# Patient Record
Sex: Male | Born: 1970 | Race: White | Hispanic: No | Marital: Single | Smoking: Never smoker
Health system: Southern US, Community
[De-identification: ages and names within clinical notes are randomized; demographics above are authoritative.]

## PROBLEM LIST (undated history)

## (undated) DIAGNOSIS — J40 Bronchitis, not specified as acute or chronic: Secondary | ICD-10-CM

## (undated) DIAGNOSIS — H65192 Other acute nonsuppurative otitis media, left ear: Principal | ICD-10-CM

## (undated) DIAGNOSIS — E669 Obesity, unspecified: Secondary | ICD-10-CM

## (undated) DIAGNOSIS — R030 Elevated blood-pressure reading, without diagnosis of hypertension: Secondary | ICD-10-CM

## (undated) HISTORY — DX: Elevated blood-pressure reading, without diagnosis of hypertension: R03.0

## (undated) HISTORY — DX: Other acute nonsuppurative otitis media, left ear: H65.192

## (undated) HISTORY — DX: Obesity, unspecified: E66.9

---

## 1998-10-30 ENCOUNTER — Emergency Department (HOSPITAL_COMMUNITY): Admission: EM | Admit: 1998-10-30 | Discharge: 1998-10-30 | Payer: Self-pay | Admitting: Emergency Medicine

## 2016-02-29 ENCOUNTER — Encounter: Payer: Self-pay | Admitting: Osteopathic Medicine

## 2016-02-29 ENCOUNTER — Ambulatory Visit (INDEPENDENT_AMBULATORY_CARE_PROVIDER_SITE_OTHER): Payer: BLUE CROSS/BLUE SHIELD | Admitting: Osteopathic Medicine

## 2016-02-29 VITALS — BP 158/95 | HR 75 | Ht 67.0 in | Wt 252.0 lb

## 2016-02-29 DIAGNOSIS — H65192 Other acute nonsuppurative otitis media, left ear: Secondary | ICD-10-CM | POA: Insufficient documentation

## 2016-02-29 DIAGNOSIS — IMO0001 Reserved for inherently not codable concepts without codable children: Secondary | ICD-10-CM

## 2016-02-29 DIAGNOSIS — R030 Elevated blood-pressure reading, without diagnosis of hypertension: Secondary | ICD-10-CM

## 2016-02-29 DIAGNOSIS — H60399 Other infective otitis externa, unspecified ear: Secondary | ICD-10-CM | POA: Insufficient documentation

## 2016-02-29 DIAGNOSIS — H60392 Other infective otitis externa, left ear: Secondary | ICD-10-CM

## 2016-02-29 DIAGNOSIS — E669 Obesity, unspecified: Secondary | ICD-10-CM

## 2016-02-29 HISTORY — DX: Reserved for inherently not codable concepts without codable children: IMO0001

## 2016-02-29 HISTORY — DX: Other acute nonsuppurative otitis media, left ear: H65.192

## 2016-02-29 MED ORDER — AMOXICILLIN-POT CLAVULANATE 875-125 MG PO TABS: 1.0000 | ORAL_TABLET | Freq: Two times a day (BID) | ORAL | Status: DC

## 2016-02-29 MED ORDER — CIPROFLOXACIN-DEXAMETHASONE 0.3-0.1 % OT SUSP: 4.0000 [drp] | Freq: Two times a day (BID) | OTIC | Status: DC

## 2016-02-29 NOTE — Progress Notes (Signed)
HPI: Drew Kim is a 45 y.o. male who presents to [facility] today for chief complaint of:  Chief Complaint  Patient presents with  . Establish Care  . Ear Pain    Ear pain:  . Location: L ear . Quality: ear drainage and pain  . Duration: few days, happened before several months ago . Timing: drainage worse in AM . Context: Records reviewed in care everywhere, patient was previously referred to ENT for left-sided hearing loss . Modifying factors: tried Peroxide and alcohol drops . Assoc signs/symptoms: no fever/chills, no neck/LN swelling, no jaw pain or difficulty swallowing.   Hypertension: Last blood pressure reading in the records from June 2016 was 152/96, blood pressure elevated in the office today as well. Reports stress and pain, no hx Rx HTN   Past medical, social and family history reviewed: History reviewed. No pertinent past medical history. History reviewed. No pertinent past surgical history. Social History  Substance Use Topics  . Smoking status: Not on file  . Smokeless tobacco: Not on file  . Alcohol Use: Not on file   History reviewed. No pertinent family history.  No current outpatient prescriptions on file.   No current facility-administered medications for this visit.   Allergies not on file    Review of Systems: CONSTITUTIONAL:  No  fever, no chills, No  unintentional weight changes HEAD/EYES/EARS/NOSE/THROAT: No  headache, no vision change, (+) hearing change, No  sore throat, No  sinus pressure CARDIAC: No  chest pain, No  pressure, No palpitations, No  orthopnea RESPIRATORY: No  cough, No  shortness of breath/wheeze GASTROINTESTINAL: No  nausea, No  vomiting, No  abdominal pain, No  blood in stool, No  diarrhea, No  constipation  MUSCULOSKELETAL: No  myalgia/arthralgia GENITOURINARY: No  incontinence, No  abnormal genital bleeding/discharge SKIN: No  rash/wounds/concerning lesions HEM/ONC: No  easy  bruising/bleeding, No  abnormal lymph node ENDOCRINE: No polyuria/polydipsia/polyphagia, No  heat/cold intolerance  NEUROLOGIC: No  weakness, No  dizziness, No  slurred speech PSYCHIATRIC: No  concerns with depression, No  concerns with anxiety, No sleep problems.   Exam:  BP 158/95 mmHg  Pulse 75  Ht  (1.702 m)  Wt 252 lb (114.306 kg)  BMI 39.46 kg/m2 Constitutional: VS see above. General Appearance: alert, well-developed, well-nourished, NAD Eyes: Normal lids and conjunctive, non-icteric sclera, PERRLA Ears, Nose, Mouth, Throat: MMM, Normal external inspection ears/nares/mouth/lips/gums, TM normal on R, TM on L not visible, unsure if intact, unsure if OM, however copious purulent discharge in ear canal, pt did not tolerate cleaning . Pharynx no erythema, no exudate.  Neck: No masses, trachea midline. No thyroid enlargement/tenderness/mass appreciated. No lymphadenopathy Respiratory: Normal respiratory effort. no wheeze, no rhonchi, no rales Cardiovascular: S1/S2 normal, no murmur, no rub/gallop auscultated. RRR.  Skin: warm, dry, intact. No rash/ulcer. No concerning nevi or subq nodules on limited exam.   Psychiatric: Normal judgment/insight. Normal mood and affect.   No results found for this or any previous visit (from the past 72 hour(s)).    ASSESSMENT/PLAN: Unable to assess L TM, unable to clean ear though attempted this. Culture obtained. ER/RTC precautions reviewed, recheck 1 week. Consider ENT referral if no improvement or if worse, if unable to visualize TM next visit. Recheck BP next visit and consider treatment.   Otitis, externa, infective, left - Plan: ciprofloxacin-dexamethasone (CIPRODEX) otic suspension, Tissue culture  Acute nonsuppurative otitis media of left ear - Plan: amoxicillin-clavulanate (AUGMENTIN) 875-125 MG tablet  Elevated BP  Obesity, Class II, BMI 35-39.9, no comorbidity (HCC)  Return in about 1 week (around 03/07/2016), or sooner if needed, for  RECHECK EAR AND BLOOD PRESSURE.

## 2016-02-29 NOTE — Patient Instructions (Signed)
Otitis Externa  Otitis externa is a bacterial or fungal infection of the outer ear canal. This is the area from the eardrum to the outside of the ear. Otitis externa is sometimes called "swimmer's ear."  CAUSES   Possible causes of infection include:   Swimming in dirty water.   Moisture remaining in the ear after swimming or bathing.   Mild injury (trauma) to the ear.   Objects stuck in the ear (foreign body).   Cuts or scrapes (abrasions) on the outside of the ear.  SIGNS AND SYMPTOMS   The first symptom of infection is often itching in the ear canal. Later signs and symptoms may include swelling and redness of the ear canal, ear pain, and yellowish-white fluid (pus) coming from the ear. The ear pain may be worse when pulling on the earlobe.  DIAGNOSIS   Your health care provider will perform a physical exam. A sample of fluid may be taken from the ear and examined for bacteria or fungi.  TREATMENT   Antibiotic ear drops are often given for 10 to 14 days. Treatment may also include pain medicine or corticosteroids to reduce itching and swelling.  HOME CARE INSTRUCTIONS    Apply antibiotic ear drops to the ear canal as prescribed by your health care provider.   Take medicines only as directed by your health care provider.   If you have diabetes, follow any additional treatment instructions from your health care provider.   Keep all follow-up visits as directed by your health care provider.  PREVENTION    Keep your ear dry. Use the corner of a towel to absorb water out of the ear canal after swimming or bathing.   Avoid scratching or putting objects inside your ear. This can damage the ear canal or remove the protective wax that lines the canal. This makes it easier for bacteria and fungi to grow.   Avoid swimming in lakes, polluted water, or poorly chlorinated pools.   You may use ear drops made of rubbing alcohol and vinegar after swimming. Combine equal parts of white vinegar and alcohol in a bottle.  Put 3 or 4 drops into each ear after swimming.  SEEK MEDICAL CARE IF:    You have a fever.   Your ear is still red, swollen, painful, or draining pus after 3 days.   Your redness, swelling, or pain gets worse.   You have a severe headache.   You have redness, swelling, pain, or tenderness in the area behind your ear.  MAKE SURE YOU:    Understand these instructions.   Will watch your condition.   Will get help right away if you are not doing well or get worse.     This information is not intended to replace advice given to you by your health care provider. Make sure you discuss any questions you have with your health care provider.     Document Released: 11/20/2005 Document Revised: 12/11/2014 Document Reviewed: 12/07/2011  Elsevier Interactive Patient Education 2016 [employer]      Otitis Media, Adult  Otitis media is redness, soreness, and inflammation of the middle ear. Otitis media may be caused by allergies or, most commonly, by infection. Often it occurs as a complication of the common cold.  SIGNS AND SYMPTOMS  Symptoms of otitis media may include:   Earache.   Fever.   Ringing in your ear.   Headache.   Leakage of fluid from the ear.  DIAGNOSIS  To diagnose otitis   media, your health care provider will examine your ear with an otoscope. This is an instrument that allows your health care provider to see into your ear in order to examine your eardrum. Your health care provider also will ask you questions about your symptoms.  TREATMENT   Typically, otitis media resolves on its own within 3-5 days. Your health care provider may prescribe medicine to ease your symptoms of pain. If otitis media does not resolve within 5 days or is recurrent, your health care provider may prescribe antibiotic medicines if he or she suspects that a bacterial infection is the cause.  HOME CARE INSTRUCTIONS    If you were prescribed an antibiotic medicine, finish it all even if you start to feel better.   Take  medicines only as directed by your health care provider.   Keep all follow-up visits as directed by your health care provider.  SEEK MEDICAL CARE IF:   You have otitis media only in one ear, or bleeding from your nose, or both.   You notice a lump on your neck.   You are not getting better in 3-5 days.   You feel worse instead of better.  SEEK IMMEDIATE MEDICAL CARE IF:    You have pain that is not controlled with medicine.   You have swelling, redness, or pain around your ear or stiffness in your neck.   You notice that part of your face is paralyzed.   You notice that the bone behind your ear (mastoid) is tender when you touch it.  MAKE SURE YOU:    Understand these instructions.   Will watch your condition.   Will get help right away if you are not doing well or get worse.     This information is not intended to replace advice given to you by your health care provider. Make sure you discuss any questions you have with your health care provider.     Document Released: 08/25/2004 Document Revised: 12/11/2014 Document Reviewed: 06/17/2013  Elsevier Interactive Patient Education 2016 [employer]

## 2016-03-01 DIAGNOSIS — E66812 Obesity, class 2: Secondary | ICD-10-CM

## 2016-03-01 DIAGNOSIS — E669 Obesity, unspecified: Secondary | ICD-10-CM

## 2016-03-01 HISTORY — DX: Obesity, class 2: E66.812

## 2016-03-01 HISTORY — DX: Obesity, unspecified: E66.9

## 2016-03-01 MED ORDER — CIPROFLOXACIN HCL 0.2 % OT SOLN: 0.2000 mL | Freq: Two times a day (BID) | OTIC | Status: DC

## 2016-03-01 NOTE — Addendum Note (Signed)
Addended by: Deirdre PippinsALEXANDER, Lyndell Allaire M on: 03/01/2016 04:43 PM   Modules accepted: Orders

## 2016-03-05 LAB — WOUND CULTURE

## 2016-03-07 ENCOUNTER — Ambulatory Visit: Payer: BLUE CROSS/BLUE SHIELD | Admitting: Osteopathic Medicine

## 2016-03-09 ENCOUNTER — Encounter: Payer: Self-pay | Admitting: Osteopathic Medicine

## 2016-03-09 ENCOUNTER — Ambulatory Visit (INDEPENDENT_AMBULATORY_CARE_PROVIDER_SITE_OTHER): Payer: BLUE CROSS/BLUE SHIELD | Admitting: Osteopathic Medicine

## 2016-03-09 VITALS — BP 150/86 | HR 73 | Wt 235.0 lb

## 2016-03-09 DIAGNOSIS — H65192 Other acute nonsuppurative otitis media, left ear: Secondary | ICD-10-CM | POA: Diagnosis not present

## 2016-03-09 DIAGNOSIS — Z139 Encounter for screening, unspecified: Secondary | ICD-10-CM | POA: Diagnosis not present

## 2016-03-09 DIAGNOSIS — I10 Essential (primary) hypertension: Secondary | ICD-10-CM

## 2016-03-09 MED ORDER — CHLORTHALIDONE 25 MG PO TABS: 12.5000 mg | ORAL_TABLET | Freq: Every day | ORAL | Status: DC

## 2016-03-09 NOTE — Progress Notes (Signed)
HPI: Drew SableMichael Kim is a 45 y.o. male who [facility] for chief complaint of:  Chief Complaint  Patient presents with  . Follow-up    BLOOD PRESSURE    Ear pain:  . Location: L ear . Quality: ear drainage and pain overall improved . Duration: few days, happened before several months ago . Context: Treated last week w/ Cipro ear drops. Records reviewed in care everywhere, patient was previously referred to ENT for left-sided hearing loss.  . Assoc signs/symptoms: no fever/chills, no neck/LN swelling, no jaw pain or difficulty swallowing.   Hypertension:  BP in office last visit was 158/95. Last blood pressure reading in the records from June 2016 was 152/96, blood pressure elevated in the office today as well. Reports stress and pain, no hx Rx HTN.    Past medical, social and family history reviewed: Past Medical History  Diagnosis Date  . Obesity, Class II, BMI 35-39.9, no comorbidity (HCC) 03/01/2016  . Elevated BP 02/29/2016  . Acute nonsuppurative otitis media of left ear 02/29/2016   No past surgical history on file.   Social History  Substance Use Topics  . Smoking status: Never Smoker   . Smokeless tobacco: Not on file  . Alcohol Use: Not on file   No family history on file.   Current Outpatient Prescriptions  Medication Sig Dispense Refill  . amoxicillin-clavulanate (AUGMENTIN) 875-125 MG tablet Take 1 tablet by mouth 2 (two) times daily. 14 tablet 0  . Ciprofloxacin HCl 0.2 % otic solution Place 0.2 mLs into the left ear 2 (two) times daily. 1 vial 0  . ciprofloxacin-dexamethasone (CIPRODEX) otic suspension Place 4 drops into the left ear 2 (two) times daily. 7.5 mL 0   No current facility-administered medications for this visit.   No Known Allergies    Review of Systems: CONSTITUTIONAL:  No  fever, no chills HEAD/EYES/EARS/NOSE/THROAT: No  headache, no vision change, (+) hearing change, No  sore throat, No   sinus pressure, (+) ear drainage form L CARDIAC: No  chest pain, No  pressure RESPIRATORY: No  cough, No  shortness of breath/wheeze NEUROLOGIC: No  weakness, No  dizziness, No  slurred speech   Exam:  BP 150/86 mmHg  Pulse 73  Wt 235 lb (106.595 kg) Constitutional: VS see above. General Appearance: alert, well-developed, well-nourished, NAD Eyes: Normal lids and conjunctive, non-icteric sclera Ears, Nose, Mouth, Throat: MMM, Normal external inspection ears/nares/mouth/lips/gums, TM normal on R, TM on L still not fully visible, unsure if intact, unsure if OM, however copious purulent discharge in ear canal though somewhat improved from previous exam. Pharynx no erythema, no exudate.  Neck: No masses, trachea midline.  Respiratory: Normal respiratory effort. no wheeze, no rhonchi, no rales Cardiovascular: S1/S2 normal, no murmur, no rub/gallop auscultated. RRR.  Psychiatric: Normal judgment/insight. Normal mood and affect.      ASSESSMENT/PLAN: Last visit was unable to assess L TM, unable to clean ear though attempted this. Culture obtained (ALCALIGENES XYLOSOXIDANS w/ intermediate sensitivity to Cipro, S to Pip/Tazo, R Ceftriaxone).    Acute nonsuppurative otitis media of left ear - Plan: Ambulatory referral to ENT  Essential hypertension, benign - Plan: CBC with Differential/Platelet, COMPLETE METABOLIC PANEL WITH GFR, TSH, Lipid panel, chlorthalidone (HYGROTON) 25 MG tablet  Screening - Plan: CBC with Differential/Platelet, COMPLETE METABOLIC PANEL WITH GFR, TSH, Lipid panel, HIV antibody  Return in about 2 weeks (around 03/23/2016), or sooner if needed, for [employer].

## 2016-03-09 NOTE — Patient Instructions (Signed)
Hypertension Hypertension, commonly called high blood pressure, is when the force of blood pumping through your arteries is too strong. Your arteries are the blood vessels that carry blood from your heart throughout your body. A blood pressure reading consists of a higher number over a lower number, such as 110/72. The higher number (systolic) is the pressure inside your arteries when your heart pumps. The lower number (diastolic) is the pressure inside your arteries when your heart relaxes. Ideally you want your blood pressure below 120/80. Hypertension forces your heart to work harder to pump blood. Your arteries may become narrow or stiff. Having untreated or uncontrolled hypertension can cause heart attack, stroke, kidney disease, and other problems. RISK FACTORS Some risk factors for high blood pressure are controllable. Others are not.  Risk factors you cannot control include:   Race. You may be at higher risk if you are African American.  Age. Risk increases with age.  Gender. Men are at higher risk than women before age 45 years. After age 65, women are at higher risk than men. Risk factors you can control include:  Not getting enough exercise or physical activity.  Being overweight.  Getting too much fat, sugar, calories, or salt in your diet.  Drinking too much alcohol. SIGNS AND SYMPTOMS Hypertension does not usually cause signs or symptoms. Extremely high blood pressure (hypertensive crisis) may cause headache, anxiety, shortness of breath, and nosebleed. DIAGNOSIS To check if you have hypertension, your health care provider will measure your blood pressure while you are seated, with your arm held at the level of your heart. It should be measured at least twice using the same arm. Certain conditions can cause a difference in blood pressure between your right and left arms. A blood pressure reading that is higher than normal on one occasion does not mean that you need treatment. If  it is not clear whether you have high blood pressure, you may be asked to return on a different day to have your blood pressure checked again. Or, you may be asked to monitor your blood pressure at home for 1 or more weeks. TREATMENT Treating high blood pressure includes making lifestyle changes and possibly taking medicine. Living a healthy lifestyle can help lower high blood pressure. You may need to change some of your habits. Lifestyle changes may include:  Following the DASH diet. This diet is high in fruits, vegetables, and whole grains. It is low in salt, red meat, and added sugars.  Keep your sodium intake below 2,300 mg per day.  Getting at least 30-45 minutes of aerobic exercise at least 4 times per week.  Losing weight if necessary.  Not smoking.  Limiting alcoholic beverages.  Learning ways to reduce stress. Your health care provider may prescribe medicine if lifestyle changes are not enough to get your blood pressure under control, and if one of the following is true:  You are 18-59 years of age and your systolic blood pressure is above 140.  You are 60 years of age or older, and your systolic blood pressure is above 150.  Your diastolic blood pressure is above 90.  You have diabetes, and your systolic blood pressure is over 140 or your diastolic blood pressure is over 90.  You have kidney disease and your blood pressure is above 140/90.  You have heart disease and your blood pressure is above 140/90. Your personal target blood pressure may vary depending on your medical conditions, your age, and other factors. HOME CARE INSTRUCTIONS    Have your blood pressure rechecked as directed by your health care provider.   Take medicines only as directed by your health care provider. Follow the directions carefully. Blood pressure medicines must be taken as prescribed. The medicine does not work as well when you skip doses. Skipping doses also puts you at risk for  problems.  Do not smoke.   Monitor your blood pressure at home as directed by your health care provider. SEEK MEDICAL CARE IF:   You think you are having a reaction to medicines taken.  You have recurrent headaches or feel dizzy.  You have swelling in your ankles.  You have trouble with your vision. SEEK IMMEDIATE MEDICAL CARE IF:  You develop a severe headache or confusion.  You have unusual weakness, numbness, or feel faint.  You have severe chest or abdominal pain.  You vomit repeatedly.  You have trouble breathing. MAKE SURE YOU:   Understand these instructions.  Will watch your condition.  Will get help right away if you are not doing well or get worse.   This information is not intended to replace advice given to you by your health care provider. Make sure you discuss any questions you have with your health care provider.   Document Released: 11/20/2005 Document Revised: 04/06/2015 Document Reviewed: 09/12/2013 Elsevier Interactive Patient Education 2016 [place] DASH stands for "Dietary Approaches to Stop Hypertension." The DASH eating plan is a healthy eating plan that has been shown to reduce high blood pressure (hypertension). Additional health benefits may include reducing the risk of type 2 diabetes mellitus, heart disease, and stroke. The DASH eating plan may also help with weight loss. WHAT DO I NEED TO KNOW ABOUT THE DASH EATING PLAN? For the DASH eating plan, you will follow these general guidelines:  Choose foods with a percent daily value for sodium of less than 5% (as listed on the food label).  Use salt-free seasonings or herbs instead of table salt or sea salt.  Check with your health care provider or pharmacist before using salt substitutes.  Eat lower-sodium products, often labeled as "lower sodium" or "no salt added."  Eat fresh foods.  Eat more vegetables, fruits, and low-fat dairy products.  Choose whole  grains. Look for the word "whole" as the first word in the ingredient list.  Choose fish and skinless chicken or [place] more often than red meat. Limit fish, poultry, and meat to 6 oz (170 g) each day.  Limit sweets, desserts, sugars, and sugary drinks.  Choose heart-healthy fats.  Limit cheese to 1 oz (28 g) per day.  Eat more home-cooked food and less restaurant, buffet, and fast food.  Limit fried foods.  Cook foods using methods other than frying.  Limit canned vegetables. If you do use them, rinse them well to decrease the sodium.  When eating at a restaurant, ask that your food be prepared with less salt, or no salt if possible. WHAT FOODS CAN I EAT? Seek help from a dietitian for individual calorie needs. Grains Whole grain or whole wheat bread. Brown rice. Whole grain or whole wheat pasta. Quinoa, bulgur, and whole grain cereals. Low-sodium cereals. Corn or whole wheat flour tortillas. Whole grain cornbread. Whole grain crackers. Low-sodium crackers. Vegetables Fresh or frozen vegetables (raw, steamed, roasted, or grilled). Low-sodium or reduced-sodium tomato and vegetable juices. Low-sodium or reduced-sodium tomato sauce and paste. Low-sodium or reduced-sodium canned vegetables.  Fruits All fresh, canned (in natural juice), or frozen fruits.  Meat and Other Protein Products Ground beef (85% or leaner), grass-fed beef, or beef trimmed of fat. Skinless chicken or [place]. Ground chicken or [place]. Pork trimmed of fat. All fish and seafood. Eggs. Dried beans, peas, or lentils. Unsalted nuts and seeds. Unsalted canned beans. Dairy Low-fat dairy products, such as skim or 1% milk, 2% or reduced-fat cheeses, low-fat ricotta or cottage cheese, or plain low-fat yogurt. Low-sodium or reduced-sodium cheeses. Fats and Oils Tub margarines without trans fats. Light or reduced-fat mayonnaise and salad dressings (reduced sodium). Avocado. Safflower, olive, or canola oils. Natural peanut or  almond butter. Other Unsalted popcorn and pretzels. The items listed above may not be a complete list of recommended foods or beverages. Contact your dietitian for more options. WHAT FOODS ARE NOT RECOMMENDED? Grains White bread. White pasta. White rice. Refined cornbread. Bagels and croissants. Crackers that contain trans fat. Vegetables Creamed or fried vegetables. Vegetables in a cheese sauce. Regular canned vegetables. Regular canned tomato sauce and paste. Regular tomato and vegetable juices. Fruits Dried fruits. Canned fruit in light or heavy syrup. Fruit juice. Meat and Other Protein Products Fatty cuts of meat. Ribs, chicken wings, bacon, sausage, bologna, salami, chitterlings, fatback, hot dogs, bratwurst, and packaged luncheon meats. Salted nuts and seeds. Canned beans with salt. Dairy Whole or 2% milk, cream, half-and-half, and cream cheese. Whole-fat or sweetened yogurt. Full-fat cheeses or blue cheese. Nondairy creamers and whipped toppings. Processed cheese, cheese spreads, or cheese curds. Condiments Onion and garlic salt, seasoned salt, table salt, and sea salt. Canned and packaged gravies. Worcestershire sauce. Tartar sauce. Barbecue sauce. Teriyaki sauce. Soy sauce, including reduced sodium. Steak sauce. Fish sauce. Oyster sauce. Cocktail sauce. Horseradish. Ketchup and mustard. Meat flavorings and tenderizers. Bouillon cubes. Hot sauce. Tabasco sauce. Marinades. Taco seasonings. Relishes. Fats and Oils Butter, stick margarine, lard, shortening, ghee, and bacon fat. Coconut, palm kernel, or palm oils. Regular salad dressings. Other Pickles and olives. Salted popcorn and pretzels. The items listed above may not be a complete list of foods and beverages to avoid. Contact your dietitian for more information. WHERE CAN I FIND MORE INFORMATION? National Heart, Lung, and Blood Institute: [employer]   This information is not intended to  replace advice given to you by your health care provider. Make sure you discuss any questions you have with your health care provider.   Document Released: 11/09/2011 Document Revised: 12/11/2014 Document Reviewed: 09/24/2013 Elsevier Interactive Patient Education 2016 Elsevier Inc.   MEDITERRANEAN DIET     Why follow it? Research shows. . Those who follow the Mediterranean diet have a reduced risk of heart disease  . The diet is associated with a reduced incidence of Parkinson's and Alzheimer's diseases . People following the diet may have longer life expectancies and lower rates of chronic diseases  . The Dietary Guidelines for Americans recommends the Mediterranean diet as an eating plan to promote health and prevent disease  What Is the Mediterranean Diet?  . Healthy eating plan based on typical foods and recipes of Mediterranean-style cooking . The diet is primarily a plant based diet; these foods should make up a majority of meals   Starches - Plant based foods should make up a majority of meals - They are an important sources of vitamins, minerals, energy, antioxidants, and fiber - Choose whole grains, foods high in fiber and minimally processed items  - Typical grain sources include wheat, oats, barley, corn, brown rice, bulgar, farro, millet, polenta, couscous  - Various types of beans include chickpeas,  lentils, fava beans, black beans, white beans   Fruits  Veggies - Large quantities of antioxidant rich fruits & veggies; 6 or more servings  - Vegetables can be eaten raw or lightly drizzled with oil and cooked  - Vegetables common to the traditional Mediterranean Diet include: artichokes, arugula, beets, broccoli, brussel sprouts, cabbage, carrots, celery, collard greens, cucumbers, eggplant, kale, leeks, lemons, lettuce, mushrooms, okra, onions, peas, peppers, potatoes, pumpkin, radishes, rutabaga, shallots, spinach, sweet potatoes, turnips, zucchini - Fruits common to the  Mediterranean Diet include: apples, apricots, avocados, cherries, clementines, dates, figs, grapefruits, grapes, melons, nectarines, oranges, peaches, pears, pomegranates, strawberries, tangerines  Fats - Replace butter and margarine with healthy oils, such as olive oil, canola oil, and tahini  - Limit nuts to no more than a handful a day  - Nuts include walnuts, almonds, pecans, pistachios, pine nuts  - Limit or avoid candied, honey roasted or heavily salted nuts - Olives are central to the [employer] - can be eaten whole or used in a variety of dishes   Meats Protein - Limiting red meat: no more than a few times a month - When eating red meat: choose lean cuts and keep the portion to the size of deck of cards - Eggs: approx. 0 to 4 times a week  - Fish and lean poultry: at least 2 a week  - Healthy protein sources include, chicken, [place], lean beef, lamb - Increase intake of seafood such as tuna, salmon, trout, mackerel, shrimp, scallops - Avoid or limit high fat processed meats such as sausage and bacon  Dairy - Include moderate amounts of low fat dairy products  - Focus on healthy dairy such as fat free yogurt, skim milk, low or reduced fat cheese - Limit dairy products higher in fat such as whole or 2% milk, cheese, ice cream  Alcohol - Moderate amounts of red wine is ok  - No more than 5 oz daily for women (all ages) and men older than age 51  - No more than 10 oz of wine daily for men younger than 47  Other - Limit sweets and other desserts  - Use herbs and spices instead of salt to flavor foods  - Herbs and spices common to the traditional Mediterranean Diet include: basil, bay leaves, chives, cloves, cumin, fennel, garlic, lavender, marjoram, mint, oregano, parsley, pepper, rosemary, sage, savory, sumac, tarragon, thyme   It's not just a diet, it's a lifestyle:  . The Mediterranean diet includes lifestyle factors typical of those in the region  . Foods, drinks and meals are  best eaten with others and savored . Daily physical activity is important for overall good health . This could be strenuous exercise like running and aerobics . This could also be more leisurely activities such as walking, housework, yard-work, or taking the stairs . Moderation is the key; a balanced and healthy diet accommodates most foods and drinks . Consider portion sizes and frequency of consumption of certain foods   Meal Ideas & Options:  . Breakfast:  o Whole wheat toast or whole wheat English muffins with peanut butter & hard boiled egg o Steel cut oats topped with apples & cinnamon and skim milk  o Fresh fruit: banana, strawberries, melon, berries, peaches  o Smoothies: strawberries, bananas, greek yogurt, peanut butter o Low fat greek yogurt with blueberries and granola  o Egg white omelet with spinach and mushrooms o Breakfast couscous: whole wheat couscous, apricots, skim milk, cranberries  . Sandwiches:  o Hummus and grilled vegetables (peppers, zucchini, squash) on whole wheat bread   o Grilled chicken on whole wheat pita with lettuce, tomatoes, cucumbers or tzatziki  o Tuna salad on whole wheat bread: tuna salad made with greek yogurt, olives, red peppers, capers, green onions o Garlic rosemary lamb pita: lamb sauted with garlic, rosemary, salt & pepper; add lettuce, cucumber, greek yogurt to pita - flavor with lemon juice and black pepper  . Seafood:  o Mediterranean grilled salmon, seasoned with garlic, basil, parsley, lemon juice and black pepper o Shrimp, lemon, and spinach whole-grain pasta salad made with low fat greek yogurt  o Seared scallops with lemon orzo  o Seared tuna steaks seasoned salt, pepper, coriander topped with tomato mixture of olives, tomatoes, olive oil, minced garlic, parsley, green onions and cappers  . Meats:  o Herbed greek chicken salad with kalamata olives, cucumber, feta  o Red bell peppers stuffed with spinach, bulgur, lean ground beef (or  lentils) & topped with feta   o Kebabs: skewers of chicken, tomatoes, onions, zucchini, squash  o [place] burgers: made with red onions, mint, dill, lemon juice, feta cheese topped with roasted red peppers . Vegetarian o Cucumber salad: cucumbers, artichoke hearts, celery, red onion, feta cheese, tossed in olive oil & lemon juice  o Hummus and whole grain pita points with a greek salad (lettuce, tomato, feta, olives, cucumbers, red onion) o Lentil soup with celery, carrots made with vegetable broth, garlic, salt and pepper  o Tabouli salad: parsley, bulgur, mint, scallions, cucumbers, tomato, radishes, lemon juice, olive oil, salt and pepper.

## 2016-03-23 ENCOUNTER — Encounter: Payer: BLUE CROSS/BLUE SHIELD | Admitting: Osteopathic Medicine

## 2016-03-30 ENCOUNTER — Encounter: Payer: BLUE CROSS/BLUE SHIELD | Admitting: Osteopathic Medicine

## 2017-06-21 ENCOUNTER — Ambulatory Visit (INDEPENDENT_AMBULATORY_CARE_PROVIDER_SITE_OTHER): Payer: BLUE CROSS/BLUE SHIELD | Admitting: Osteopathic Medicine

## 2017-06-21 ENCOUNTER — Encounter: Payer: Self-pay | Admitting: Osteopathic Medicine

## 2017-06-21 VITALS — BP 142/84 | HR 78 | Ht 67.0 in | Wt 291.0 lb

## 2017-06-21 DIAGNOSIS — Z23 Encounter for immunization: Secondary | ICD-10-CM

## 2017-06-21 DIAGNOSIS — H6692 Otitis media, unspecified, left ear: Secondary | ICD-10-CM | POA: Diagnosis not present

## 2017-06-21 DIAGNOSIS — H65192 Other acute nonsuppurative otitis media, left ear: Secondary | ICD-10-CM

## 2017-06-21 DIAGNOSIS — Z0000 Encounter for general adult medical examination without abnormal findings: Secondary | ICD-10-CM | POA: Diagnosis not present

## 2017-06-21 DIAGNOSIS — I10 Essential (primary) hypertension: Secondary | ICD-10-CM | POA: Diagnosis not present

## 2017-06-21 DIAGNOSIS — H9192 Unspecified hearing loss, left ear: Secondary | ICD-10-CM

## 2017-06-21 DIAGNOSIS — H60392 Other infective otitis externa, left ear: Secondary | ICD-10-CM

## 2017-06-21 MED ORDER — LOSARTAN POTASSIUM 25 MG PO TABS: 25.0000 mg | ORAL_TABLET | Freq: Every day | ORAL | 1 refills | Status: DC

## 2017-06-21 MED ORDER — AMOXICILLIN-POT CLAVULANATE 875-125 MG PO TABS: 1.0000 | ORAL_TABLET | Freq: Two times a day (BID) | ORAL | 0 refills | Status: DC

## 2017-06-21 MED ORDER — CIPROFLOXACIN HCL 0.2 % OT SOLN: 0.2000 mL | Freq: Two times a day (BID) | OTIC | 0 refills | Status: DC

## 2017-06-21 NOTE — Progress Notes (Signed)
HPI: Annie SableMichael Bither is a 46 y.o. male  who [facility] today, 06/21/17,  for chief complaint of:  Chief Complaint  Patient presents with  . Annual Exam    Patient here for annual physical / wellness exam.  See preventive care reviewed as below.  Recent labs reviewed in detail with the patient - ordered, never done!   Additional concerns today include:   HTN: never got meds filled. Wife was diagnosed with cancer so his health took a bit of a back seat.   Ear complaints: never made it to ENT due to wife's health issues, still some pain and hearing is coming/going    Past medical, surgical, social and family history reviewed: Patient Active Problem List   Diagnosis Date Noted  . 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 Otitis, externa, infective 02/29/2016  . Acute nonsuppurative otitis media of left ear 02/29/2016  . Elevated BP 02/29/2016   No past surgical history on file. Social History  Substance Use Topics  . Smoking status: Never Smoker  . Smokeless tobacco: Never Used  . Alcohol use Not on file   No family history on file.   Current medication list and allergy/intolerance information reviewed:   Current Outpatient Prescriptions  Medication Sig Dispense Refill  . amoxicillin-clavulanate (AUGMENTIN) 875-125 MG tablet Take 1 tablet by mouth 2 (two) times daily. 14 tablet 0  . chlorthalidone (HYGROTON) 25 MG tablet Take 0.5 tablets (12.5 mg total) by mouth daily. 30 tablet 0  . Ciprofloxacin HCl 0.2 % otic solution Place 0.2 mLs into the left ear 2 (two) times daily. 1 vial 0   No current facility-administered medications for this visit.    No Known Allergies    Review of Systems:  Constitutional:  No  fever, no chills, No recent illness, +unintentional weight changes - gain. No significant fatigue.   HEENT: No  headache, no vision change, +hearing change, No sore throat, No  sinus  pressure  Cardiac: No  chest pain, No  pressure, No palpitations, No  Orthopnea  Respiratory:  No  shortness of breath. No  Cough  Gastrointestinal: No  abdominal pain, No  nausea, No  vomiting,  Musculoskeletal: No new myalgia/arthralgia  Skin: No  Rash  Hem/Onc: No  easy bruising/bleeding  Endocrine: No cold intolerance,  No heat intolerance. No polyuria/polydipsia/polyphagia   Neurologic: No  weakness, No  dizziness,   Psychiatric: No  concerns with depression, No  concerns with anxiety, No sleep problems, No mood problems  Exam:  BP (!) 142/84   Pulse 78   Ht 5\' 7"  (1.702 m)   Wt 291 lb (132 kg)   SpO2 97%   BMI 45.58 kg/m   Constitutional: VS see above. General Appearance: alert, well-developed, well-nourished, NAD  Eyes: Normal lids and conjunctive, non-icteric sclera  Ears, Nose, Mouth, Throat: MMM, Normal external inspection ears/nares/mouth/lips/gums. TM normal on R, on L (+)otitis externa and some scarred TM va esufion no erythema/bulging . Pharynx/tonsils no erythema, no exudate. Nasal mucosa normal.   Neck: No masses, trachea midline. No thyroid enlargement. No tenderness/mass appreciated. No lymphadenopathy  Respiratory: Normal respiratory effort. no wheeze, no rhonchi, no rales  Cardiovascular: S1/S2 normal, no murmur, no rub/gallop auscultated. RRR. No lower extremity edema.   Gastrointestinal: Habitus limits exam. Nontender, no masses. No hepatomegaly, no splenomegaly. No hernia appreciated. Bowel sounds normal. Rectal exam deferred.   Musculoskeletal: Gait normal. No clubbing/cyanosis of digits.   Neurological: Normal  balance/coordination. No tremor.   Skin: warm, dry, intact. No rash/ulcer. No concerning nevi or subq nodules on limited exam.    Psychiatric: Normal judgment/insight. Normal mood and affect. Oriented x3.     ASSESSMENT/PLAN:   Annual physical exam  Acute nonsuppurative otitis media of left ear - Plan: amoxicillin-clavulanate  (AUGMENTIN) 875-125 MG tablet, Ambulatory referral to ENT  Otitis, externa, infective, left - Plan: Ciprofloxacin HCl 0.2 % otic solution, Ambulatory referral to ENT  Essential hypertension, benign - Plan: losartan (COZAAR) 25 MG tablet  Recurrent otitis media, left - Plan: Ambulatory referral to ENT  Hearing deficit, left - Plan: Ambulatory referral to ENT  Need for tetanus, diphtheria, and acellular pertussis (Tdap) vaccine - Plan: Tdap vaccine greater than or equal to 7yo IM   MALE PREVENTIVE CARE  updated 06/21/17  ANNUAL SCREENING/COUNSELING  Any changes to health in the past year? Gained a good deal of weight   Diet/Exercise - HEALTHY HABITS DISCUSSED TO DECREASE CV RISK History  Smoking Status  . Never Smoker  Smokeless Tobacco  . Never Used   History  Alcohol use Not on file   No flowsheet data found.  SEXUAL/REPRODUCTIVE HEALTH  Sexually active in the past year? - Yes with male.  STI testing needed/desired today? - no  Any concerns with testosterone/libido? - no  INFECTIOUS DISEASE SCREENING  HIV - needs - declined  GC/CT - does not need  HepC - does not need  TB - does not need  CANCER SCREENING  Lung - does not need  Colon - does not need  Prostate - does not need  OTHER DISEASE SCREENING  Lipid - needs  DM2 - needs  AAA - 65-75yo ever smoked: does not need  Osteoporosis - men 46yo+ - does not need  ADULT VACCINATION  Influenza - annual vaccine recommended  Td - booster every 10 years   Zoster - option at 68, yes at 60+   PCV13 - was not indicated  PPSV23 - was not indicated Immunization History  Administered Date(s) Administered  . Tdap 06/21/2017        Patient Instructions  ENT office: [phone]  I sent another referral since it's been more than a year     Visit summary with medication list and pertinent instructions was printed for patient to review. All questions at time of visit were answered - patient  instructed to contact office with any additional concerns. ER/RTC precautions were reviewed with the patient. Follow-up plan: Return in about 2 weeks (around 07/05/2017) for recheck blood pressure and review lab results.

## 2017-06-21 NOTE — Patient Instructions (Signed)
ENT office: [phone]  I sent another referral since it's been more than a year

## 2017-12-28 DIAGNOSIS — H7192 Unspecified cholesteatoma, left ear: Secondary | ICD-10-CM | POA: Diagnosis not present

## 2018-01-10 DIAGNOSIS — H7190 Unspecified cholesteatoma, unspecified ear: Secondary | ICD-10-CM | POA: Diagnosis not present

## 2018-01-10 DIAGNOSIS — H7192 Unspecified cholesteatoma, left ear: Secondary | ICD-10-CM | POA: Diagnosis not present

## 2018-03-29 DIAGNOSIS — H9212 Otorrhea, left ear: Secondary | ICD-10-CM | POA: Diagnosis not present

## 2018-03-29 DIAGNOSIS — H7192 Unspecified cholesteatoma, left ear: Secondary | ICD-10-CM | POA: Diagnosis not present

## 2018-03-29 DIAGNOSIS — H90A32 Mixed conductive and sensorineural hearing loss, unilateral, left ear with restricted hearing on the contralateral side: Secondary | ICD-10-CM | POA: Diagnosis not present

## 2018-03-31 ENCOUNTER — Other Ambulatory Visit: Payer: Self-pay

## 2018-03-31 ENCOUNTER — Emergency Department
Admission: EM | Admit: 2018-03-31 | Discharge: 2018-03-31 | Disposition: A | Discharge status: Home / Self Care | Payer: BLUE CROSS/BLUE SHIELD | Source: Home / Self Care

## 2018-03-31 DIAGNOSIS — M25511 Pain in right shoulder: Secondary | ICD-10-CM

## 2018-03-31 DIAGNOSIS — I10 Essential (primary) hypertension: Secondary | ICD-10-CM | POA: Diagnosis not present

## 2018-03-31 MED ORDER — DICLOFENAC SODIUM 50 MG PO TBEC: 50.0000 mg | DELAYED_RELEASE_TABLET | Freq: Two times a day (BID) | ORAL | 0 refills | Status: DC

## 2018-03-31 MED ORDER — HYDROCHLOROTHIAZIDE 25 MG PO TABS: 25.0000 mg | ORAL_TABLET | Freq: Every day | ORAL | 1 refills | Status: DC

## 2018-03-31 MED ORDER — METHOCARBAMOL 500 MG PO TABS: 500.0000 mg | ORAL_TABLET | Freq: Two times a day (BID) | ORAL | 0 refills | Status: DC

## 2018-03-31 NOTE — Discharge Instructions (Signed)
Schedule appointment for evaluation of blood pressure and neck/shoulder pain

## 2018-03-31 NOTE — ED Triage Notes (Signed)
Pt c/o right sided shoulder pain that starts in his mid upper back. Was dx with stg 2 degenerative disc disease by a chiro in 2007/08. Taking ibuprofen as needed. Also worried about his recent spike in BP readings.

## 2018-03-31 NOTE — ED Provider Notes (Signed)
[facility]    CSN: [id number] Arrival date & time: 03/31/18  1209     History   Chief Complaint Chief Complaint  Patient presents with  . Shoulder Pain    Right  . Hypertension    HPI Drew Kim is a 47 y.o. male.   The history is provided by the patient. No language interpreter was used.  Shoulder Pain  Location:  Shoulder Shoulder location:  R shoulder Injury: no   Pain details:    Quality:  Aching   Radiates to:  R forearm and R upper arm   Severity:  Moderate   Timing:  Constant   Progression:  Worsening Dislocation: no   Foreign body present:  No foreign bodies Relieved by:  Nothing Worsened by:  Nothing Risk factors: no known bone disorder   Hypertension    Pt is also out of his blood pressure medication.  Pt reports his wife was diagnosed with cancer and he has not been back to primary for recheck of blood pressure or recheck of back.   Past Medical History:  Diagnosis Date  . Acute nonsuppurative otitis media of left ear 02/29/2016  . Elevated BP 02/29/2016  . Obesity, Class II, BMI 35-39.9, no comorbidity 03/01/2016    Patient Active Problem List   Diagnosis Date Noted  . 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 Otitis, externa, infective 02/29/2016  . Acute nonsuppurative otitis media of left ear 02/29/2016  . Elevated BP 02/29/2016    History reviewed. No pertinent surgical history.     Home Medications    Prior to Admission medications   Medication Sig Start Date End Date Taking? Authorizing Provider  Ciprofloxacin HCl 0.2 % otic solution Place 0.2 mLs into the left ear 2 (two) times daily. 06/21/17   Sunnie Nielsen, DO  diclofenac (VOLTAREN) 50 MG EC tablet Take 1 tablet (50 mg total) by mouth 2 (two) times daily. 03/31/18   Elson Areas, PA-C  hydrochlorothiazide (HYDRODIURIL) 25 MG tablet Take 1 tablet (25 mg total) by mouth daily. 03/31/18   Elson Areas, PA-C  losartan (COZAAR) 25 MG tablet  Take 1 tablet (25 mg total) by mouth daily. Patient not taking: Reported on 03/31/2018 06/21/17   Sunnie Nielsen, DO  methocarbamol (ROBAXIN) 500 MG tablet Take 1 tablet (500 mg total) by mouth 2 (two) times daily. 03/31/18   Elson Areas, PA-C    Family History History reviewed. No pertinent family history.  Social History Social History   Tobacco Use  . Smoking status: Never Smoker  . Smokeless tobacco: Never Used  Substance Use Topics  . Alcohol use: Yes    Alcohol/week: 0.0 oz    Comment: OCC  . Drug use: Never     Allergies   Patient has no known allergies.   Review of Systems Review of Systems  All other systems reviewed and are negative.    Physical Exam Triage Vital Signs ED Triage Vitals  Enc Vitals Group     BP 03/31/18 1235 (!) 161/121     Pulse Rate 03/31/18 1235 78     Resp 03/31/18 1235 18     Temp 03/31/18 1235 97.8 F (36.6 C)     Temp Source 03/31/18 1235 Oral     SpO2 03/31/18 1235 97 %     Weight 03/31/18 1236 283 lb (128.4 kg)     Height 03/31/18 1236  (1.676 m)     Head Circumference --  Peak Flow --      Pain Score 03/31/18 1235 4     Pain Loc --      Pain Edu? --      Excl. in GC? --    No data found.  Updated Vital Signs BP (!) 161/121 (BP Location: Right Arm)   Pulse 78   Temp 97.8 F (36.6 C) (Oral)   Resp 18   Ht  (1.676 m)   Wt 283 lb (128.4 kg)   SpO2 97%   BMI 45.68 kg/m   Visual Acuity Right Eye Distance:   Left Eye Distance:   Bilateral Distance:    Right Eye Near:   Left Eye Near:    Bilateral Near:     Physical Exam  Constitutional: He appears well-developed and well-nourished.  HENT:  Head: Normocephalic and atraumatic.  Eyes: Conjunctivae are normal.  Neck: Neck supple.  Cardiovascular: Normal rate and regular rhythm.  No murmur heard. Pulmonary/Chest: Effort normal and breath sounds normal. No respiratory distress.  Abdominal: There is no tenderness.  Musculoskeletal: Normal  range of motion. He exhibits no edema.  Tender cervcal spine and shoulder   Neurological: He is alert.  Skin: Skin is warm and dry.  Psychiatric: He has a normal mood and affect.  Nursing note and vitals reviewed.    UC Treatments / Results  Labs (all labs ordered are listed, but only abnormal results are displayed) Labs Reviewed - No data to display  EKG None Radiology No results found.  Procedures Procedures (including critical care time)  Medications Ordered in UC Medications - No data to display   Initial Impression / Assessment and Plan / UC Course  I have reviewed the triage vital signs and the nursing notes.  Pertinent labs & imaging results that were available during my care of the patient were reviewed by me and considered in my medical decision making (see chart for details).     MDM  I will restart pt's blood pressure medication.  He is advised to schedule to see Dr. Lyn Hollingshead for recheck of blood pressure and neck and shoulder  Final Clinical Impressions(s) / UC Diagnoses   Final diagnoses:  Hypertension, unspecified type  Acute pain of right shoulder    ED Discharge Orders        Ordered    hydrochlorothiazide (HYDRODIURIL) 25 MG tablet  Daily     03/31/18 1258    methocarbamol (ROBAXIN) 500 MG tablet  2 times daily     03/31/18 1258    diclofenac (VOLTAREN) 50 MG EC tablet  2 times daily     03/31/18 1258       Controlled Substance Prescriptions [place] Controlled Substance Registry consulted? Not Applicable   Elson Areas, [place] 03/31/18 9323

## 2018-07-20 ENCOUNTER — Encounter (HOSPITAL_BASED_OUTPATIENT_CLINIC_OR_DEPARTMENT_OTHER): Payer: Self-pay | Admitting: Emergency Medicine

## 2018-07-20 ENCOUNTER — Other Ambulatory Visit: Payer: Self-pay

## 2018-07-20 ENCOUNTER — Emergency Department (HOSPITAL_BASED_OUTPATIENT_CLINIC_OR_DEPARTMENT_OTHER)
Admission: EM | Admit: 2018-07-20 | Discharge: 2018-07-20 | Disposition: A | Discharge status: Home / Self Care | Payer: BLUE CROSS/BLUE SHIELD | Attending: Emergency Medicine | Admitting: Emergency Medicine

## 2018-07-20 DIAGNOSIS — M545 Low back pain, unspecified: Secondary | ICD-10-CM

## 2018-07-20 DIAGNOSIS — M549 Dorsalgia, unspecified: Secondary | ICD-10-CM | POA: Diagnosis not present

## 2018-07-20 LAB — URINALYSIS, ROUTINE W REFLEX MICROSCOPIC
BILIRUBIN URINE: NEGATIVE
Glucose, UA: NEGATIVE mg/dL
KETONES UR: NEGATIVE mg/dL
Leukocytes, UA: NEGATIVE
NITRITE: NEGATIVE
PH: 6 (ref 5.0–8.0)
Protein, ur: NEGATIVE mg/dL
Specific Gravity, Urine: 1.025 (ref 1.005–1.030)

## 2018-07-20 LAB — URINALYSIS, MICROSCOPIC (REFLEX)
Squamous Epithelial / LPF: NONE SEEN (ref 0–5)
WBC UA: NONE SEEN WBC/hpf (ref 0–5)

## 2018-07-20 MED ORDER — NAPROXEN 500 MG PO TABS: 500.0000 mg | ORAL_TABLET | Freq: Two times a day (BID) | ORAL | 0 refills | Status: DC

## 2018-07-20 MED ORDER — DEXAMETHASONE SODIUM PHOSPHATE 10 MG/ML IJ SOLN
10.0000 mg | Freq: Once | INTRAMUSCULAR | Status: AC
  Administered 2018-07-20: 10 mg via INTRAMUSCULAR
  Filled 2018-07-20: qty 1

## 2018-07-20 MED ORDER — METHOCARBAMOL 500 MG PO TABS: 500.0000 mg | ORAL_TABLET | Freq: Every evening | ORAL | 0 refills | Status: DC | PRN

## 2018-07-20 NOTE — Discharge Instructions (Signed)
Take naproxen 2 times a day with meals.  Do not take other anti-inflammatories at the same time (Advil, Motrin, ibuprofen, Aleve). You may supplement with Tylenol if you need further pain control. Use Robaxin as needed for muscle stiffness or soreness. Have caution, as this may make you tired or groggy. Do not drive or operate heavy machinery while taking this medication.  Use muscle creams (bengay, icy hot, salonpas) as needed for pain.  Use heat or ice to help with pain. Try the back stretches described in the paperwork. Follow up with your primary care doctor if pain is not improving with this treatment in 1 week.  Return to the ER if you develop high fevers, numbness, loss of bowel or bladder control, or any new or concerning symptoms.

## 2018-07-20 NOTE — ED Triage Notes (Signed)
Patient states that he has had lower back pain right > left for the last few days  - he states that it hurts worse with certain movements

## 2018-07-21 NOTE — ED Provider Notes (Signed)
[facility] EMERGENCY DEPARTMENT Provider Note   CSN: [id number] Arrival date & time: 07/20/18  1549     History   Chief Complaint Chief Complaint  Patient presents with  . Back Pain    HPI Drew Kim is a 47 y.o. male presenting for evaluation of back pain.  Patient states he has been having right-sided back pain for the past month.  Over the past few days, it has worsened.  Pain is worse when he first wakes up in the morning and when moving from one position to the other.  No pain when staying still.  He reports pain is a shooting/spasm pain.  Reports a history of chronic low back pain, although states this feels different.  He denies fall, trauma, or injury.  He denies fevers, chills, rash, loss of bowel or bladder control, history of cancer, or history of IV drug use.  He has not been taking anything for pain including Tylenol or ibuprofen.  He states he works in [employer], is very physically active.  He denies association with oral intake or anterior abdominal pain.  He denies associated nausea or vomiting.  He denies urinary symptoms including hematuria, urinary frequency, or dysuria.  He reports a history of high blood pressure for which she is supposed to be taking medication, ran out about 5 days ago.  He will follow-up with his PCP regarding his blood pressure.  HPI  Past Medical History:  Diagnosis Date  . Acute nonsuppurative otitis media of left ear 02/29/2016  . Elevated BP 02/29/2016  . Obesity, Class II, BMI 35-39.9, no comorbidity 03/01/2016    Patient Active Problem List   Diagnosis Date Noted  . 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 Otitis, externa, infective 02/29/2016  . Acute nonsuppurative otitis media of left ear 02/29/2016  . Elevated BP 02/29/2016    History reviewed. No pertinent surgical history.      Home Medications    Prior to Admission medications   Medication Sig Start Date End Date Taking?  Authorizing Provider  Ciprofloxacin HCl 0.2 % otic solution Place 0.2 mLs into the left ear 2 (two) times daily. 06/21/17   Sunnie NielsenAlexander, Natalie, DO  diclofenac (VOLTAREN) 50 MG EC tablet Take 1 tablet (50 mg total) by mouth 2 (two) times daily. 03/31/18   Elson AreasSofia, Leslie K, PA-C  hydrochlorothiazide (HYDRODIURIL) 25 MG tablet Take 1 tablet (25 mg total) by mouth daily. 03/31/18   Elson AreasSofia, Leslie K, PA-C  losartan (COZAAR) 25 MG tablet Take 1 tablet (25 mg total) by mouth daily. Patient not taking: Reported on 03/31/2018 06/21/17   Sunnie NielsenAlexander, Natalie, DO  methocarbamol (ROBAXIN) 500 MG tablet Take 1 tablet (500 mg total) by mouth at bedtime as needed for muscle spasms. 07/20/18   Shenique Childers, PA-C  naproxen (NAPROSYN) 500 MG tablet Take 1 tablet (500 mg total) by mouth 2 (two) times daily with a meal. 07/20/18   Jasdeep Kepner, PA-C    Family History History reviewed. No pertinent family history.  Social History Social History   Tobacco Use  . Smoking status: Never Smoker  . Smokeless tobacco: Never Used  Substance Use Topics  . Alcohol use: Yes    Alcohol/week: 0.0 standard drinks    Comment: OCC  . Drug use: Never     Allergies   Patient has no known allergies.   Review of Systems Review of Systems  Constitutional: Negative for fever.  Genitourinary: Negative for dysuria, flank pain and hematuria.  Musculoskeletal: Positive for back pain.     Physical Exam Updated Vital Signs BP (!) 140/110 (BP Location: Left Arm)   Pulse 80   Temp 98.1 F (36.7 C) (Oral)   Resp 18   Ht 5\' 7"  (1.702 m)   Wt 128.4 kg   SpO2 100%   BMI 44.32 kg/m   Physical Exam  Constitutional: He is oriented to person, place, and time. He appears well-developed and well-nourished. No distress.  Resting comfortably in bed in no apparent distress  HENT:  Head: Normocephalic and atraumatic.  Eyes: EOM are normal.  Neck: Normal range of motion.  Cardiovascular: Normal rate, regular rhythm and  intact distal pulses.  Pulmonary/Chest: Effort normal and breath sounds normal. No respiratory distress. He has no wheezes.  Abdominal: Soft. He exhibits no distension. There is no tenderness.  No tenderness to palpation of the abdomen.  Soft without rigidity, guarding, distention.  Negative Murphy's.  No rebound or signs of peritonitis.  Musculoskeletal: Normal range of motion. He exhibits tenderness. He exhibits no edema.  Tenderness palpation of right sided back musculature without pain over midline spine.  No step-offs or deformities.  No saddle paresthesia.  Pedal pulses intact bilaterally.  No worsening pain with straight leg raise.  Patient is ambulatory without difficulty.  Neurological: He is alert and oriented to person, place, and time. No sensory deficit.  Skin: Skin is warm. Capillary refill takes less than 2 seconds. No rash noted.  Psychiatric: He has a normal mood and affect.  Nursing note and vitals reviewed.    ED Treatments / Results  Labs (all labs ordered are listed, but only abnormal results are displayed) Labs Reviewed  URINALYSIS, ROUTINE W REFLEX MICROSCOPIC - Abnormal; Notable for the following components:      Result Value   Hgb urine dipstick TRACE (*)    All other components within normal limits  URINALYSIS, MICROSCOPIC (REFLEX) - Abnormal; Notable for the following components:   Bacteria, UA MANY (*)    All other components within normal limits    EKG None  Radiology No results found.  Procedures Procedures (including critical care time)  Medications Ordered in ED Medications  dexamethasone (DECADRON) injection 10 mg (10 mg Intramuscular Given 07/20/18 1752)     Initial Impression / Assessment and Plan / ED Course  I have reviewed the triage vital signs and the nursing notes.  Pertinent labs & imaging results that were available during my care of the patient were reviewed by me and considered in my medical decision making (see chart for  details).     Patient presenting for evaluation of R sided back pain.  Physical exam reassuring, neurovascularly intact.  No red flags for back pain.  Pain is reproducible with palpation of the musculature.  Likely musculoskeletal.  Doubt fracture, I do not believe x-rays will be beneficial.  Doubt vertebral injury, infection, spinal cord compression, myelopathy, or cauda equina syndrome. UA obtained, trace urine. As sxs have been present for 1 month and are only present with movement, doubt kidney stone or UTI. No urinary sxs. Pt concerned sxs are related to his gallbladder, as pt has no n/v, anterior abd pain, or association with food, doubt GB etiology.  Will treat symptomatically with NSAIDs, muscle relaxers, muscle creams.  Patient to follow-up with primary care.  At this time, patient appears safe for discharge.  Return precautions given.  Patient states he understands agrees plan.  Final Clinical Impressions(s) / ED Diagnoses   Final diagnoses:  Acute right-sided low back pain without sciatica    ED Discharge Orders         Ordered    naproxen (NAPROSYN) 500 MG tablet  2 times daily with meals     07/20/18 1741    methocarbamol (ROBAXIN) 500 MG tablet  At bedtime PRN     07/20/18 1741           Mechel Haggard, PA-C 07/21/18 0103    Charlynne PanderYao, David Hsienta, MD 07/25/18 [phone]

## 2018-07-26 ENCOUNTER — Encounter: Payer: Self-pay | Admitting: Osteopathic Medicine

## 2018-07-26 ENCOUNTER — Ambulatory Visit: Payer: BLUE CROSS/BLUE SHIELD | Admitting: Osteopathic Medicine

## 2018-07-26 ENCOUNTER — Ambulatory Visit (INDEPENDENT_AMBULATORY_CARE_PROVIDER_SITE_OTHER): Payer: BLUE CROSS/BLUE SHIELD

## 2018-07-26 VITALS — BP 140/83 | HR 60 | Temp 98.0°F | Wt 290.7 lb

## 2018-07-26 DIAGNOSIS — M47896 Other spondylosis, lumbar region: Secondary | ICD-10-CM

## 2018-07-26 DIAGNOSIS — M545 Low back pain, unspecified: Secondary | ICD-10-CM

## 2018-07-26 DIAGNOSIS — R231 Pallor: Secondary | ICD-10-CM | POA: Diagnosis not present

## 2018-07-26 DIAGNOSIS — I10 Essential (primary) hypertension: Secondary | ICD-10-CM

## 2018-07-26 DIAGNOSIS — G8929 Other chronic pain: Secondary | ICD-10-CM | POA: Diagnosis not present

## 2018-07-26 LAB — COMPLETE METABOLIC PANEL WITH GFR
AG Ratio: 1.6 (calc) (ref 1.0–2.5)
ALBUMIN MSPROF: 4.6 g/dL (ref 3.6–5.1)
ALKALINE PHOSPHATASE (APISO): 68 U/L (ref 40–115)
ALT: 23 U/L (ref 9–46)
AST: 17 U/L (ref 10–40)
BUN: 15 mg/dL (ref 7–25)
CALCIUM: 9.4 mg/dL (ref 8.6–10.3)
CO2: 24 mmol/L (ref 20–32)
CREATININE: 0.82 mg/dL (ref 0.60–1.35)
Chloride: 105 mmol/L (ref 98–110)
GFR, EST NON AFRICAN AMERICAN: 105 mL/min/{1.73_m2} (ref >=60)
GFR, Est African American: 122 mL/min/{1.73_m2} (ref >=60)
GLOBULIN: 2.8 g/dL (ref 1.9–3.7)
GLUCOSE: 96 mg/dL (ref 65–99)
Potassium: 4.1 mmol/L (ref 3.5–5.3)
SODIUM: 139 mmol/L (ref 135–146)
Total Bilirubin: 1 mg/dL (ref 0.2–1.2)
Total Protein: 7.4 g/dL (ref 6.1–8.1)

## 2018-07-26 LAB — CBC WITH DIFFERENTIAL/PLATELET
BASOS ABS: 20 {cells}/uL (ref 0–200)
BASOS PCT: 0.3 %
EOS ABS: 101 {cells}/uL (ref 15–500)
Eosinophils Relative: 1.5 %
HCT: 43.5 % (ref 38.5–50.0)
Hemoglobin: 15.1 g/dL (ref 13.2–17.1)
Lymphs Abs: 2104 cells/uL (ref 850–3900)
MCH: 30.6 pg (ref 27.0–33.0)
MCHC: 34.7 g/dL (ref 32.0–36.0)
MCV: 88.1 fL (ref 80.0–100.0)
MONOS PCT: 8.6 %
MPV: 11.7 fL (ref 7.5–12.5)
NEUTROS PCT: 58.2 %
Neutro Abs: 3899 cells/uL (ref 1500–7800)
PLATELETS: 182 10*3/uL (ref 140–400)
RBC: 4.94 10*6/uL (ref 4.20–5.80)
RDW: 12.6 % (ref 11.0–15.0)
TOTAL LYMPHOCYTE: 31.4 %
WBC: 6.7 10*3/uL (ref 3.8–10.8)
WBCMIX: 576 {cells}/uL (ref 200–950)

## 2018-07-26 LAB — LIPID PANEL
CHOL/HDL RATIO: 5.1 (calc) — AB (ref <5.0)
CHOLESTEROL: 240 mg/dL — AB (ref <200)
HDL: 47 mg/dL (ref >40)
LDL CHOLESTEROL (CALC): 158 mg/dL — AB
NON-HDL CHOLESTEROL (CALC): 193 mg/dL — AB (ref <130)
Triglycerides: 195 mg/dL — ABNORMAL HIGH (ref <150)

## 2018-07-26 LAB — TSH: TSH: 0.81 mIU/L (ref 0.40–4.50)

## 2018-07-26 MED ORDER — HYDROCHLOROTHIAZIDE 25 MG PO TABS: 25.0000 mg | ORAL_TABLET | Freq: Every day | ORAL | 1 refills | Status: DC

## 2018-07-26 MED ORDER — LOSARTAN POTASSIUM 25 MG PO TABS: 25.0000 mg | ORAL_TABLET | Freq: Every day | ORAL | 1 refills | Status: DC

## 2018-07-26 MED ORDER — MELOXICAM 15 MG PO TABS: 15.0000 mg | ORAL_TABLET | Freq: Every day | ORAL | 0 refills | Status: DC

## 2018-07-26 NOTE — Progress Notes (Signed)
HPI: Drew Kim is a 47 y.o. male who  has a past medical history of Acute nonsuppurative otitis media of left ear (02/29/2016), Elevated BP (02/29/2016), and Obesity, Class II, BMI 35-39.9, no comorbidity (03/01/2016).  he presents to [facility] today, 07/26/18,  for chief complaint of:  BP follow-up Back pain  Records reviewed:  07/20/18 ER visit: R back pain x1 months, tc w/ NSAID, muscle relaxer, muscle cream. No imaging. Out of BP meds 5 days or so.  03/31/18 urgent care visit: HTN and R shoulder pain, was out of BP meds at that time. 161/121 BP. Meds restarted  Back pain . Context: no injury . Location: R flank and going down R leg . Quality: shooting/spasm pain in lower back. . Duration: few months, worse past month . Timing: worse in AM . Modifying factors: worse w/ twisting to the L . Assoc signs/symptoms: in the mornings, R leg seems colder to the touch than his L leg for the past couple months, but no loss of sensation/numbnes or tingling.   Lower leg pallor/cold . Location: R lower extremity below knee . Quality: cooler skin, non painful . Duration: about a month maybe more . Timing: only in mornings, not every morning, lasts awhile then gets better w/  . Context: R LBP maybe related?  . Assoc signs/symptoms: no loss of sensation, no paresthesia, no claudication   BP today not terrible. No CP/SOB.      Past medical history, surgical history, and family history reviewed.  Current medication list and allergy/intolerance information reviewed.   (See remainder of HPI, ROS, Phys Exam below)    ASSESSMENT/PLAN:   Chronic right-sided low back pain without sciatica - seems likely muscle spasm, arthritis, possible DDD, maybe herniated disc. I don't think the pallor is related, see below.  - Plan: DG Lumbar Spine 2-3 Views, meloxicam (MOBIC) 15 MG tablet, COMPLETE METABOLIC PANEL WITH GFR, Lipid panel, TSH, CBC with  Differential/Platelet  Pallor - seems more circulatory, transient more reassuring than persistent/worsening. Normal PE at this time. Obesity, HTN, ?glc = risks for vascular disease  - Plan: CT ANGIO LOW EXTREM RIGHT W &/OR WO CONTRAST, CT ANGIO PELVIS W OR WO CONTRAST, COMPLETE METABOLIC PANEL WITH GFR, Lipid panel, TSH, CBC with Differential/Platelet  Essential hypertension, benign - Plan: hydrochlorothiazide (HYDRODIURIL) 25 MG tablet, losartan (COZAAR) 25 MG tablet, COMPLETE METABOLIC PANEL WITH GFR, Lipid panel, TSH, CBC with Differential/Platelet    Meds ordered this encounter  Medications  . hydrochlorothiazide (HYDRODIURIL) 25 MG tablet    Sig: Take 1 tablet (25 mg total) by mouth daily.    Dispense:  30 tablet    Refill:  1  . losartan (COZAAR) 25 MG tablet    Sig: Take 1 tablet (25 mg total) by mouth daily.    Dispense:  30 tablet    Refill:  1  . meloxicam (MOBIC) 15 MG tablet    Sig: Take 1 tablet (15 mg total) by mouth daily. Take every day for one week then daily as needed for aches/pains after that    Dispense:  30 tablet    Refill:  0     Follow-up plan: Return in about 2 weeks (around 08/09/2018) for recheck bP and go over results, sooner if needed .                       ############################################ ############################################ ############################################ ############################################    Outpatient Encounter Medications as of 07/26/2018  Medication Sig  . Ciprofloxacin HCl 0.2 % otic solution Place 0.2 mLs into the left ear 2 (two) times daily.  . diclofenac (VOLTAREN) 50 MG EC tablet Take 1 tablet (50 mg total) by mouth 2 (two) times daily.  . hydrochlorothiazide (HYDRODIURIL) 25 MG tablet Take 1 tablet (25 mg total) by mouth daily.  Marland Kitchen. losartan (COZAAR) 25 MG tablet Take 1 tablet (25 mg total) by mouth daily. (Patient not taking: Reported on 03/31/2018)  . methocarbamol (ROBAXIN)  500 MG tablet Take 1 tablet (500 mg total) by mouth at bedtime as needed for muscle spasms.  . naproxen (NAPROSYN) 500 MG tablet Take 1 tablet (500 mg total) by mouth 2 (two) times daily with a meal.   No facility-administered encounter medications on file as of 07/26/2018.    No Known Allergies    Review of Systems:  Constitutional: No recent illness, no fever/chills   HEENT: No  headache, no vision change  Cardiac: No  chest pain, No  pressure, No palpitations  Respiratory:  No  shortness of breath. No  Cough  Gastrointestinal: No  abdominal pain, no change on bowel habits  Musculoskeletal: +myalgia/arthralgia as per HPI  Skin: No  Rash  Hem/Onc: No  easy bruising/bleeding, No  abnormal lumps/bumps  Neurologic: No  weakness, No  Dizziness  Psychiatric: No  concerns with depression, No  concerns with anxiety  Exam:  BP 140/83 (BP Location: Left Arm, Patient Position: Sitting, Cuff Size: Large)   Pulse 60   Temp 98 F (36.7 C) (Oral)   Wt 290 lb 11.2 oz (131.9 kg)   BMI 45.53 kg/m   Constitutional: VS see above. General Appearance: alert, well-developed, well-nourished, NAD  Eyes: Normal lids and conjunctive, non-icteric sclera  Ears, Nose, Mouth, Throat: MMM, Normal external inspection ears/nares/mouth/lips/gums.  Neck: No masses, trachea midline.   Respiratory: Normal respiratory effort. no wheeze, no rhonchi, no rales  Cardiovascular: S1/S2 normal, no murmur, no rub/gallop auscultated. RRR. Trace LE edema bilaterally equally   Musculoskeletal: Gait normal. Symmetric and independent movement of all extremities. Neg Homan's bilaterally. No temperature difference I can appreciate. Normal capillary refill.   Neurological: Normal balance/coordination. No tremor.  Skin: warm, dry, intact.   Psychiatric: Normal judgment/insight. Normal mood and affect. Oriented x3.   Visit summary with medication list and pertinent instructions was printed for patient to review,  advised to alert us if any changes needed. All questions at time of visit were answered - patient instructed to contact office with any additional concerns. ER/RTC precautions were reviewed with the patient and understanding verbalized.   Follow-up plan: Return in about 2 weeks (around 08/09/2018) for recheck bP and go over results, sooner if needed .  Note: Total time spent 25 minutes, greater than 50% of the visit was spent face-to-face counseling and coordinating care for the following: The primary encounter diagnosis was Chronic right-sided low back pain without sciatica. Diagnoses of Pallor and Essential hypertension, benign were also pertinent to this visit.Marland Kitchen.  Please note: voice recognition software was used to produce this document, and typos may escape review. Please contact Dr. Lyn HollingsheadAlexander for any needed clarifications.

## 2018-08-01 ENCOUNTER — Other Ambulatory Visit: Payer: BLUE CROSS/BLUE SHIELD

## 2018-08-01 ENCOUNTER — Ambulatory Visit (INDEPENDENT_AMBULATORY_CARE_PROVIDER_SITE_OTHER): Payer: BLUE CROSS/BLUE SHIELD

## 2018-08-01 DIAGNOSIS — M79605 Pain in left leg: Secondary | ICD-10-CM

## 2018-08-01 DIAGNOSIS — M79604 Pain in right leg: Secondary | ICD-10-CM

## 2018-08-01 DIAGNOSIS — K8020 Calculus of gallbladder without cholecystitis without obstruction: Secondary | ICD-10-CM

## 2018-08-01 DIAGNOSIS — M545 Low back pain: Secondary | ICD-10-CM

## 2018-08-01 MED ORDER — IOPAMIDOL (ISOVUE-370) INJECTION 76%
125.0000 mL | Freq: Once | INTRAVENOUS | Status: AC | PRN
  Administered 2018-08-01: 125 mL via INTRAVENOUS

## 2018-08-08 ENCOUNTER — Ambulatory Visit: Payer: BLUE CROSS/BLUE SHIELD | Admitting: Osteopathic Medicine

## 2018-08-16 ENCOUNTER — Encounter: Payer: Self-pay | Admitting: Osteopathic Medicine

## 2018-08-16 ENCOUNTER — Ambulatory Visit (INDEPENDENT_AMBULATORY_CARE_PROVIDER_SITE_OTHER): Payer: BLUE CROSS/BLUE SHIELD | Admitting: Osteopathic Medicine

## 2018-08-16 VITALS — BP 133/74 | HR 71 | Wt 286.0 lb

## 2018-08-16 DIAGNOSIS — I10 Essential (primary) hypertension: Secondary | ICD-10-CM | POA: Diagnosis not present

## 2018-08-16 DIAGNOSIS — M62830 Muscle spasm of back: Secondary | ICD-10-CM

## 2018-08-16 DIAGNOSIS — E782 Mixed hyperlipidemia: Secondary | ICD-10-CM

## 2018-08-16 MED ORDER — HYDROCHLOROTHIAZIDE 25 MG PO TABS: 25.0000 mg | ORAL_TABLET | Freq: Every day | ORAL | 3 refills | Status: DC

## 2018-08-16 MED ORDER — LOSARTAN POTASSIUM 25 MG PO TABS: 25.0000 mg | ORAL_TABLET | Freq: Every day | ORAL | 3 refills | Status: DC

## 2018-08-16 MED ORDER — CYCLOBENZAPRINE HCL 5 MG PO TABS: 5.0000 mg | ORAL_TABLET | Freq: Three times a day (TID) | ORAL | 1 refills | Status: DC | PRN

## 2018-08-16 NOTE — Progress Notes (Signed)
HPI: Drew Kim is a 47 y.o. male who  has a past medical history of Acute nonsuppurative otitis media of left ear (02/29/2016), Elevated BP (02/29/2016), and Obesity, Class II, BMI 35-39.9, no comorbidity (03/01/2016).  he presents to [facility] today, 08/16/18,  for chief complaint of:  BP check Review CTA results  Back pain  Last visit we adjusted blood pressure medications, he is tolerating losartan 25 and HCTZ 25 pretty well, no dizziness, no chest pain or shortness of breath.  Blood pressure looks good today.  The pallor of the lower extremities has resolved and the CT angiogram was benign.  He continues to have some back pain more in the lower thoracic/upper lumbar area.  We reviewed his x-ray together, looks like there may be some arthritic changes there but nothing major, the lower spine looks good.  He says the meloxicam is helping a little bit but now and then he will have spasms especially when he is turning or lifting.  Working on weight loss and has lost a few pounds since his last visit.  He is working on some exercises that his chiropractor gave him for the back pain.  We reviewed his cholesterol numbers as well, borderline, he is working on diet/exercise to improve these numbers and does not want to be on medication right now.     Past medical history, surgical history, and family history reviewed.  Current medication list and allergy/intolerance information reviewed.   (See remainder of HPI, ROS, Phys Exam below)    ASSESSMENT/PLAN:   Essential hypertension, benign - Plan: hydrochlorothiazide (HYDRODIURIL) 25 MG tablet, losartan (COZAAR) 25 MG tablet, BASIC METABOLIC PANEL WITH GFR  Spasm of thoracolumbar muscle - Plan: cyclobenzaprine (FLEXERIL) 5 MG tablet  Mixed hyperlipidemia   Meds ordered this encounter  Medications  . hydrochlorothiazide (HYDRODIURIL) 25 MG tablet    Sig: Take 1 tablet (25 mg total) by mouth daily.     Dispense:  90 tablet    Refill:  3    Cancel 30 days rx thanks  . losartan (COZAAR) 25 MG tablet    Sig: Take 1 tablet (25 mg total) by mouth daily.    Dispense:  90 tablet    Refill:  3    Cancel 30 days rx thanks  . cyclobenzaprine (FLEXERIL) 5 MG tablet    Sig: Take 1-2 tablets (5-10 mg total) by mouth 3 (three) times daily as needed for muscle spasms.    Dispense:  90 tablet    Refill:  1    Follow-up plan: Return in about 6 months (around 02/14/2019) for recheck cholesterol and weight and BP, sooner if needed .                          ############################################ ############################################ ############################################ ############################################    Outpatient Encounter Medications as of 08/16/2018  Medication Sig  . hydrochlorothiazide (HYDRODIURIL) 25 MG tablet Take 1 tablet (25 mg total) by mouth daily.  Marland Kitchen losartan (COZAAR) 25 MG tablet Take 1 tablet (25 mg total) by mouth daily.  . meloxicam (MOBIC) 15 MG tablet Take 1 tablet (15 mg total) by mouth daily. Take every day for one week then daily as needed for aches/pains after that   No facility-administered encounter medications on file as of 08/16/2018.    No Known Allergies    Review of Systems:  Constitutional: No recent illness  Cardiac: No  chest pain, No  pressure, No  palpitations  Respiratory:  No  shortness of breath. No  Cough  Gastrointestinal: No  abdominal pain, no change on bowel habits  Musculoskeletal: +myalgia/arthralgia  Skin: No  Rash  Hem/Onc: No  easy bruising/bleeding, No  abnormal lumps/bumps  Neurologic: No  weakness, No  Dizziness  Psychiatric: No  concerns with depression, No  concerns with anxiety  Exam:  BP 133/74   Pulse 71   Wt 286 lb (129.7 kg)   SpO2 99%   BMI 44.79 kg/m   Wt Readings from Last 3 Encounters:  08/16/18 286 lb (129.7 kg)  07/26/18 290 lb 11.2 oz (131.9 kg)   07/20/18 283 lb (128.4 kg)     Constitutional: VS see above. General Appearance: alert, well-developed, well-nourished, NAD  Eyes: Normal lids and conjunctive, non-icteric sclera  Ears, Nose, Mouth, Throat: MMM, Normal external inspection ears/nares/mouth/lips/gums.  Neck: No masses, trachea midline.   Respiratory: Normal respiratory effort. no wheeze, no rhonchi, no rales  Cardiovascular: S1/S2 normal, no murmur, no rub/gallop auscultated. RRR.   Musculoskeletal: Gait normal. Symmetric and independent movement of all extremities.  No midline spinal tenderness, some thoracic paraspinal tenderness on the right side T10-T12 area  Neurological: Normal balance/coordination. No tremor.  Skin: warm, dry, intact.   Psychiatric: Normal judgment/insight. Normal mood and affect. Oriented x3.   Visit summary with medication list and pertinent instructions was printed for patient to review, advised to alert us if any changes needed. All questions at time of visit were answered - patient instructed to contact office with any additional concerns. ER/RTC precautions were reviewed with the patient and understanding verbalized.   Follow-up plan: Return in about 6 months (around 02/14/2019) for recheck cholesterol and weight and BP, sooner if needed .    Please note: voice recognition software was used to produce this document, and typos may escape review. Please contact Dr. Lyn HollingsheadAlexander for any needed clarifications.

## 2018-10-18 DIAGNOSIS — H90A21 Sensorineural hearing loss, unilateral, right ear, with restricted hearing on the contralateral side: Secondary | ICD-10-CM | POA: Diagnosis not present

## 2018-10-18 DIAGNOSIS — H7292 Unspecified perforation of tympanic membrane, left ear: Secondary | ICD-10-CM | POA: Diagnosis not present

## 2018-10-18 DIAGNOSIS — H90A32 Mixed conductive and sensorineural hearing loss, unilateral, left ear with restricted hearing on the contralateral side: Secondary | ICD-10-CM | POA: Diagnosis not present

## 2018-12-02 DIAGNOSIS — H90A32 Mixed conductive and sensorineural hearing loss, unilateral, left ear with restricted hearing on the contralateral side: Secondary | ICD-10-CM | POA: Diagnosis not present

## 2019-02-14 ENCOUNTER — Ambulatory Visit: Payer: BLUE CROSS/BLUE SHIELD | Admitting: Osteopathic Medicine

## 2019-02-14 ENCOUNTER — Telehealth: Payer: Self-pay | Admitting: Osteopathic Medicine

## 2019-02-14 NOTE — Telephone Encounter (Signed)
Patient called stating that they could not make it. They have rescheduled and no further questions at this time.

## 2019-02-20 ENCOUNTER — Telehealth: Payer: Self-pay | Admitting: Osteopathic Medicine

## 2019-02-20 MED ORDER — IRBESARTAN 75 MG PO TABS: 75.0000 mg | ORAL_TABLET | Freq: Every day | ORAL | 0 refills | Status: DC

## 2019-02-20 NOTE — Telephone Encounter (Signed)
Losartan on back order, alternative sent. Needs to keep follow-up to maintain medications. OK to refill everything until then.

## 2019-02-20 NOTE — Telephone Encounter (Signed)
Pt has been updated. Aware of bp med changed. No other inquiries during call.

## 2019-03-10 ENCOUNTER — Ambulatory Visit: Payer: BLUE CROSS/BLUE SHIELD | Admitting: Osteopathic Medicine

## 2019-04-18 ENCOUNTER — Ambulatory Visit: Payer: BLUE CROSS/BLUE SHIELD | Admitting: Osteopathic Medicine

## 2019-05-14 ENCOUNTER — Other Ambulatory Visit: Payer: Self-pay | Admitting: Osteopathic Medicine

## 2019-05-29 ENCOUNTER — Other Ambulatory Visit: Payer: Self-pay | Admitting: Osteopathic Medicine

## 2019-06-04 ENCOUNTER — Other Ambulatory Visit: Payer: Self-pay | Admitting: *Deleted

## 2019-06-04 MED ORDER — IRBESARTAN 75 MG PO TABS: 75.0000 mg | ORAL_TABLET | Freq: Every day | ORAL | 0 refills | Status: DC

## 2019-06-18 ENCOUNTER — Other Ambulatory Visit: Payer: Self-pay | Admitting: Osteopathic Medicine

## 2019-08-25 ENCOUNTER — Other Ambulatory Visit: Payer: Self-pay | Admitting: Osteopathic Medicine

## 2019-08-25 DIAGNOSIS — I10 Essential (primary) hypertension: Secondary | ICD-10-CM

## 2019-08-25 NOTE — Telephone Encounter (Signed)
Requested medication (s) are due for refill today: yes  Requested medication (s) are on the active medication list: yes  Last refill:  05/19/2019  Future visit scheduled: no  Notes to clinic:  Medication was discontinue    Requested Prescriptions  Pending Prescriptions Disp Refills   losartan (COZAAR) 25 MG tablet [Pharmacy Med Name: LOSARTAN POTASSIUM 25 MG TAB] 90 tablet 3    Sig: TAKE 1 TABLET BY MOUTH EVERY DAY     Cardiovascular:  Angiotensin Receptor Blockers Failed - 08/25/2019 12:47 AM      Failed - Cr in normal range and within 180 days    Creat  Date Value Ref Range Status  07/26/2018 0.82 0.60 - 1.35 mg/dL Final         Failed - K in normal range and within 180 days    Potassium  Date Value Ref Range Status  07/26/2018 4.1 3.5 - 5.3 mmol/L Final         Failed - Valid encounter within last 6 months    Recent Outpatient Visits          1 year ago Essential hypertension, benign   [place] [facility], Lanelle Bal, DO   1 year ago Chronic right-sided low back pain without sciatica   [place] [facility] [place], Lanelle Bal, DO   2 years ago Annual physical exam   [facility], Lanelle Bal, DO   3 years ago Acute nonsuppurative otitis media of left ear   [place] [facility], Lanelle Bal, DO   3 years ago Otitis, externa, infective, left   [facility], Lanelle Bal, DO             Passed - Patient is not pregnant      Passed - Last BP in normal range    BP Readings from Last 1 Encounters:  08/16/18 133/74

## 2019-08-27 ENCOUNTER — Other Ambulatory Visit: Payer: Self-pay | Admitting: Osteopathic Medicine

## 2019-08-27 DIAGNOSIS — I10 Essential (primary) hypertension: Secondary | ICD-10-CM

## 2019-09-08 ENCOUNTER — Other Ambulatory Visit: Payer: Self-pay | Admitting: Osteopathic Medicine

## 2019-09-08 DIAGNOSIS — I10 Essential (primary) hypertension: Secondary | ICD-10-CM

## 2019-09-10 ENCOUNTER — Other Ambulatory Visit: Payer: Self-pay | Admitting: Osteopathic Medicine

## 2019-09-10 DIAGNOSIS — I10 Essential (primary) hypertension: Secondary | ICD-10-CM

## 2019-09-10 NOTE — Telephone Encounter (Signed)
Requested medication (s) are due for refill today: yes  Requested medication (s) are on the active medication list: yes  Last refill:  08/27/19  Future visit scheduled: no  Notes to clinic:  Requesting 90 day supply   Requested Prescriptions  Pending Prescriptions Disp Refills   hydrochlorothiazide (HYDRODIURIL) 25 MG tablet [Pharmacy Med Name: HYDROCHLOROTHIAZIDE 25 MG TAB] 90 tablet 1    Sig: TAKE 1 TABLET BY MOUTH EVERY DAY     Cardiovascular: Diuretics - Thiazide Failed - 09/10/2019  1:02 PM      Failed - Ca in normal range and within 360 days    Calcium  Date Value Ref Range Status  07/26/2018 9.4 8.6 - 10.3 mg/dL Final         Failed - Cr in normal range and within 360 days    Creat  Date Value Ref Range Status  07/26/2018 0.82 0.60 - 1.35 mg/dL Final         Failed - K in normal range and within 360 days    Potassium  Date Value Ref Range Status  07/26/2018 4.1 3.5 - 5.3 mmol/L Final         Failed - Na in normal range and within 360 days    Sodium  Date Value Ref Range Status  07/26/2018 139 135 - 146 mmol/L Final         Failed - Valid encounter within last 6 months    Recent Outpatient Visits          1 year ago Essential hypertension, benign   [place] [facility], Lanelle Bal, DO   1 year ago Chronic right-sided low back pain without sciatica   [place] [facility] [place], Lanelle Bal, DO   2 years ago Annual physical exam   [facility], Lanelle Bal, DO   3 years ago Acute nonsuppurative otitis media of left ear   [place] [facility], Lanelle Bal, DO   3 years ago Otitis, externa, infective, left   [place] [facility] [place], DO             Passed - Last BP in normal range    BP Readings from Last 1 Encounters:  08/16/18 133/74

## 2019-10-16 ENCOUNTER — Other Ambulatory Visit: Payer: Self-pay | Admitting: Osteopathic Medicine

## 2019-10-16 DIAGNOSIS — I10 Essential (primary) hypertension: Secondary | ICD-10-CM

## 2019-10-16 NOTE — Telephone Encounter (Signed)
Requested medication (s) are due for refill today: yes  Requested medication (s) are on the active medication list: yes  Last refill: 09/08/2019  Future visit scheduled: no  Notes to clinic:  Patient requesting 90 day supply Overdue for office visit    Requested Prescriptions  Pending Prescriptions Disp Refills   losartan (COZAAR) 25 MG tablet [Pharmacy Med Name: LOSARTAN POTASSIUM 25 MG TAB] 90 tablet 1    Sig: TAKE 1 TABLET BY MOUTH EVERY DAY     Cardiovascular:  Angiotensin Receptor Blockers Failed - 10/16/2019 12:19 PM      Failed - Cr in normal range and within 180 days    Creat  Date Value Ref Range Status  07/26/2018 0.82 0.60 - 1.35 mg/dL Final         Failed - K in normal range and within 180 days    Potassium  Date Value Ref Range Status  07/26/2018 4.1 3.5 - 5.3 mmol/L Final         Failed - Valid encounter within last 6 months    Recent Outpatient Visits          1 year ago Essential hypertension, benign   [place] [facility], Lanelle Bal, DO   1 year ago Chronic right-sided low back pain without sciatica   [place] [facility] [place], Lanelle Bal, DO   2 years ago Annual physical exam   [facility], Lanelle Bal, DO   3 years ago Acute nonsuppurative otitis media of left ear   [place] [facility], Lanelle Bal, DO   3 years ago Otitis, externa, infective, left   [facility], Lanelle Bal, DO             Passed - Patient is not pregnant      Passed - Last BP in normal range    BP Readings from Last 1 Encounters:  08/16/18 133/74

## 2019-10-31 ENCOUNTER — Other Ambulatory Visit: Payer: Self-pay | Admitting: Osteopathic Medicine

## 2019-10-31 DIAGNOSIS — I10 Essential (primary) hypertension: Secondary | ICD-10-CM

## 2019-10-31 NOTE — Telephone Encounter (Signed)
Requested medication (s) are due for refill today: yes  Requested medication (s) are on the active medication list: yes  Last refill:  09/10/2019  Future visit scheduled: no  Notes to clinic:  Overdue for office visit  Review for refill   Requested Prescriptions  Pending Prescriptions Disp Refills   hydrochlorothiazide (HYDRODIURIL) 25 MG tablet [Pharmacy Med Name: HYDROCHLOROTHIAZIDE 25 MG TAB] 30 tablet     Sig: TAKE 1 TABLET BY MOUTH EVERY DAY     Cardiovascular: Diuretics - Thiazide Failed - 10/31/2019 10:04 AM      Failed - Ca in normal range and within 360 days    Calcium  Date Value Ref Range Status  07/26/2018 9.4 8.6 - 10.3 mg/dL Final         Failed - Cr in normal range and within 360 days    Creat  Date Value Ref Range Status  07/26/2018 0.82 0.60 - 1.35 mg/dL Final         Failed - K in normal range and within 360 days    Potassium  Date Value Ref Range Status  07/26/2018 4.1 3.5 - 5.3 mmol/L Final         Failed - Na in normal range and within 360 days    Sodium  Date Value Ref Range Status  07/26/2018 139 135 - 146 mmol/L Final         Failed - Valid encounter within last 6 months    Recent Outpatient Visits          1 year ago Essential hypertension, benign   [place] [facility], Lanelle Bal, DO   1 year ago Chronic right-sided low back pain without sciatica   [place] [facility] [place], Lanelle Bal, DO   2 years ago Annual physical exam   [facility], Lanelle Bal, DO   3 years ago Acute nonsuppurative otitis media of left ear   [place] [facility], Lanelle Bal, DO   3 years ago Otitis, externa, infective, left   [place] [facility] [place], DO             Passed - Last BP in normal range    BP Readings from Last 1 Encounters:  08/16/18 133/74

## 2019-11-03 NOTE — Telephone Encounter (Signed)
Must make office visit 

## 2019-11-09 ENCOUNTER — Other Ambulatory Visit: Payer: Self-pay

## 2019-11-09 ENCOUNTER — Emergency Department (INDEPENDENT_AMBULATORY_CARE_PROVIDER_SITE_OTHER)
Admission: EM | Admit: 2019-11-09 | Discharge: 2019-11-09 | Disposition: A | Discharge status: Home / Self Care | Payer: HRSA Program | Source: Home / Self Care | Attending: Family Medicine | Admitting: Family Medicine

## 2019-11-09 ENCOUNTER — Encounter: Payer: Self-pay | Admitting: Emergency Medicine

## 2019-11-09 DIAGNOSIS — U071 COVID-19: Secondary | ICD-10-CM

## 2019-11-09 HISTORY — DX: Bronchitis, not specified as acute or chronic: J40

## 2019-11-09 LAB — POC SARS CORONAVIRUS 2 AG -  ED: SARS Coronavirus 2 Ag: POSITIVE — AB

## 2019-11-09 MED ORDER — GUAIFENESIN-CODEINE 100-10 MG/5ML PO SOLN: ORAL | 0 refills | Status: DC

## 2019-11-09 NOTE — Discharge Instructions (Signed)
Take plain guaifenesin (1286m extended release tabs such as Mucinex) twice daily, with plenty of water, for cough and congestion.  May continue Pseudoephedrine (38m one or two every 4 to 6 hours) for sinus congestion.  Get adequate rest.   Try warm salt water gargles for sore throat.  Stop all antihistamines for now, and other non-prescription cough/cold preparations. May take Ibuprofen 20022m4 tabs every 8 hours with food for chest/sternum discomfort.   Isolate yourself until the below conditions are met: 1)  At least 7 days since symptoms onset. AND 2)  > 72 hours after symptom resolution (absence of fever without the use of fever-reducing medicine, and improvement in respiratory symptoms.

## 2019-11-09 NOTE — ED Provider Notes (Signed)
[facility]    CSN: [id number] Arrival date & time: 11/09/19  1119      History   Chief Complaint Chief Complaint  Patient presents with  . Cough  . Diarrhea  . Chills    HPI Drew Kim is a 48 y.o. male.   Patient complains of one week history of intermittent nausea, low grade fever/chills, and loose stools.  He developed a cough 5 days ago.  He denies chest tightness, shortness of breath, and changes in taste/smell.    The history is provided by the patient.    Past Medical History:  Diagnosis Date  . Acute nonsuppurative otitis media of left ear 02/29/2016  . Bronchitis   . Elevated BP 02/29/2016  . Obesity, Class II, BMI 35-39.9, no comorbidity 03/01/2016    Patient Active Problem List   Diagnosis Date Noted  . 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 Otitis, externa, infective 02/29/2016  . Acute nonsuppurative otitis media of left ear 02/29/2016  . Elevated BP 02/29/2016    History reviewed. No pertinent surgical history.     Home Medications    Prior to Admission medications   Medication Sig Start Date End Date Taking? Authorizing Provider  cyclobenzaprine (FLEXERIL) 5 MG tablet Take 1-2 tablets (5-10 mg total) by mouth 3 (three) times daily as needed for muscle spasms. 08/16/18   Emeterio Reeve, DO  guaiFENesin-codeine 100-10 MG/5ML syrup Take 68m by mouth at bedtime as needed for cough. 11/09/19   BKandra Nicolas MD  hydrochlorothiazide (HYDRODIURIL) 25 MG tablet Take 1 tablet (25 mg total) by mouth daily. MUST MAKE OFFICE VISIT 11/03/19   AEmeterio Reeve DO  irbesartan (AVAPRO) 75 MG tablet Take 1 tablet (75 mg total) by mouth daily. LABS/APPT FOR FURTHER REFILLS 06/04/19   AEmeterio Reeve DO  losartan (COZAAR) 25 MG tablet TAKE 1 TABLET BY MOUTH EVERY DAY 10/16/19   AEmeterio Reeve DO  meloxicam (MOBIC) 15 MG tablet Take 1 tablet (15 mg total) by mouth daily. Take every day for one week then daily as  needed for aches/pains after that 07/26/18   AEmeterio Reeve DO    Family History No family history on file.  Social History Social History   Tobacco Use  . Smoking status: Never Smoker  . Smokeless tobacco: Never Used  Substance Use Topics  . Alcohol use: Yes    Alcohol/week: 0.0 standard drinks    Comment: OCC  . Drug use: Never     Allergies   Patient has no known allergies.   Review of Systems Review of Systems No sore throat + cough No pleuritic pain No wheezing No nasal congestion No post-nasal drainage No sinus pain/pressure No itchy/red eyes No earache No hemoptysis No SOB + low grade fever/chills + nausea No vomiting No abdominal pain + diarrhea No urinary symptoms No skin rash + fatigue + myalgias No headache   Physical Exam Triage Vital Signs ED Triage Vitals  Enc Vitals Group     BP 11/09/19 1151 125/81     Pulse Rate 11/09/19 1151 95     Resp 11/09/19 1151 18     Temp 11/09/19 1151 98.5 F (36.9 C)     Temp Source 11/09/19 1151 Oral     SpO2 --      Weight 11/09/19 1152 270 lb (122.5 kg)     Height 11/09/19 1152 '5\' 7"'  (1.702 m)     Head Circumference --      Peak Flow --  Pain Score 11/09/19 1152 0     Pain Loc --      Pain Edu? --      Excl. in [place]? --    No data found.  Updated Vital Signs BP 125/81 (BP Location: Right Arm)   Pulse 95   Temp 98.5 F (36.9 C) (Oral)   Resp 18   Ht '5\' 7"'  (1.702 m)   Wt 122.5 kg   BMI 42.29 kg/m   Visual Acuity Right Eye Distance:   Left Eye Distance:   Bilateral Distance:    Right Eye Near:   Left Eye Near:    Bilateral Near:     Physical Exam Nursing notes and Vital Signs reviewed. Appearance:  Patient appears stated age, and in no acute distress Eyes:  Pupils are equal, round, and reactive to light and accomodation.  Extraocular movement is intact.  Conjunctivae are not inflamed  Ears:  Canals normal.  Tympanic membranes normal.  Nose:  Mildly congested turbinates.  No  sinus tenderness.  Pharynx:  Normal Neck:  Supple.  Lungs:  Clear to auscultation.  Breath sounds are equal.  Moving air well. Heart:  Regular rate and rhythm without murmurs, rubs, or gallops.  Abdomen:  Nontender without masses or hepatosplenomegaly.  Bowel sounds are present.  No CVA or flank tenderness.  Extremities:  No edema.  Skin:  No rash present.   UC Treatments / Results  Labs (all labs ordered are listed, but only abnormal results are displayed) Labs Reviewed  POC SARS CORONAVIRUS 2 AG -  ED - Abnormal; Notable for the following components:      Result Value   SARS Coronavirus 2 Ag Positive (*)    All other components within normal limits    EKG   Radiology No results found.  Procedures Procedures (including critical care time)  Medications Ordered in UC Medications - No data to display  Initial Impression / Assessment and Plan / UC Course  I have reviewed the triage vital signs and the nursing notes.  Pertinent labs & imaging results that were available during my care of the patient were reviewed by me and considered in my medical decision making (see chart for details).    Benign exam. Rx for Robitussin AC for night time cough. Controlled Substance Prescriptions I have consulted the [place] Controlled Substances Registry for this patient, and feel the risk/benefit ratio today is favorable for proceeding with this prescription for a controlled substance.   If symptoms become significantly worse during the night or over the weekend, proceed to the local emergency room.    Final Clinical Impressions(s) / UC Diagnoses   Final diagnoses:  [id number] virus infection     Discharge Instructions     Take plain guaifenesin (1243m extended release tabs such as Mucinex) twice daily, with plenty of water, for cough and congestion.  May continue Pseudoephedrine (325m one or two every 4 to 6 hours) for sinus congestion.  Get adequate rest.   Try warm salt water gargles  for sore throat.  Stop all antihistamines for now, and other non-prescription cough/cold preparations. May take Ibuprofen 20074m4 tabs every 8 hours with food for chest/sternum discomfort.   Isolate yourself until the below conditions are met: 1)  At least 7 days since symptoms onset. AND 2)  > 72 hours after symptom resolution (absence of fever without the use of fever-reducing medicine, and improvement in respiratory symptoms.        ED Prescriptions  Medication Sig Dispense Auth. Provider   guaiFENesin-codeine 100-10 MG/5ML syrup Take 63m by mouth at bedtime as needed for cough. 50 mL BKandra Nicolas MD        BKandra Nicolas MD 11/14/19 [phone]

## 2019-11-09 NOTE — ED Triage Notes (Signed)
Patient reports one week history of varying problems with fever, chills, diarrhea, and now persistent cough. No known exposure to covid posititive person, nor has he travelled.  He has not had influenza vacc this season.

## 2019-11-10 ENCOUNTER — Other Ambulatory Visit: Payer: Self-pay | Admitting: Osteopathic Medicine

## 2019-11-10 DIAGNOSIS — I10 Essential (primary) hypertension: Secondary | ICD-10-CM

## 2019-11-10 NOTE — Telephone Encounter (Signed)
Requested medication (s) are due for refill today: yes  Requested medication (s) are on the active medication list: yes  Last refill:  11/03/2019  Future visit scheduled: no  Notes to clinic:  Review for refill   Requested Prescriptions  Pending Prescriptions Disp Refills   hydrochlorothiazide (HYDRODIURIL) 25 MG tablet [Pharmacy Med Name: HYDROCHLOROTHIAZIDE 25 MG TAB] 15 tablet 0    Sig: Take 1 tablet (25 mg total) by mouth daily. MUST MAKE OFFICE VISIT     Cardiovascular: Diuretics - Thiazide Failed - 11/10/2019 11:30 AM      Failed - Ca in normal range and within 360 days    Calcium  Date Value Ref Range Status  07/26/2018 9.4 8.6 - 10.3 mg/dL Final         Failed - Cr in normal range and within 360 days    Creat  Date Value Ref Range Status  07/26/2018 0.82 0.60 - 1.35 mg/dL Final         Failed - K in normal range and within 360 days    Potassium  Date Value Ref Range Status  07/26/2018 4.1 3.5 - 5.3 mmol/L Final         Failed - Na in normal range and within 360 days    Sodium  Date Value Ref Range Status  07/26/2018 139 135 - 146 mmol/L Final         Failed - Valid encounter within last 6 months    Recent Outpatient Visits          1 year ago Essential hypertension, benign   [place] [facility], Lanelle Bal, DO   1 year ago Chronic right-sided low back pain without sciatica   [place] [facility] [place], Lanelle Bal, DO   2 years ago Annual physical exam   [facility], Lanelle Bal, DO   3 years ago Acute nonsuppurative otitis media of left ear   [place] [facility], Lanelle Bal, DO   3 years ago Otitis, externa, infective, left   [place] [facility] [place], DO             Passed - Last BP in normal range    BP Readings from Last 1 Encounters:  11/09/19 125/81

## 2019-11-20 IMAGING — DX DG LUMBAR SPINE 2-3V
3 series · 3 of 3 positions shown · non-contrast
Comparison: None.

CLINICAL DATA: Right-sided low back pain, 2 months duration.

EXAM:
LUMBAR SPINE - 2-3 VIEW

[l-spine ap]
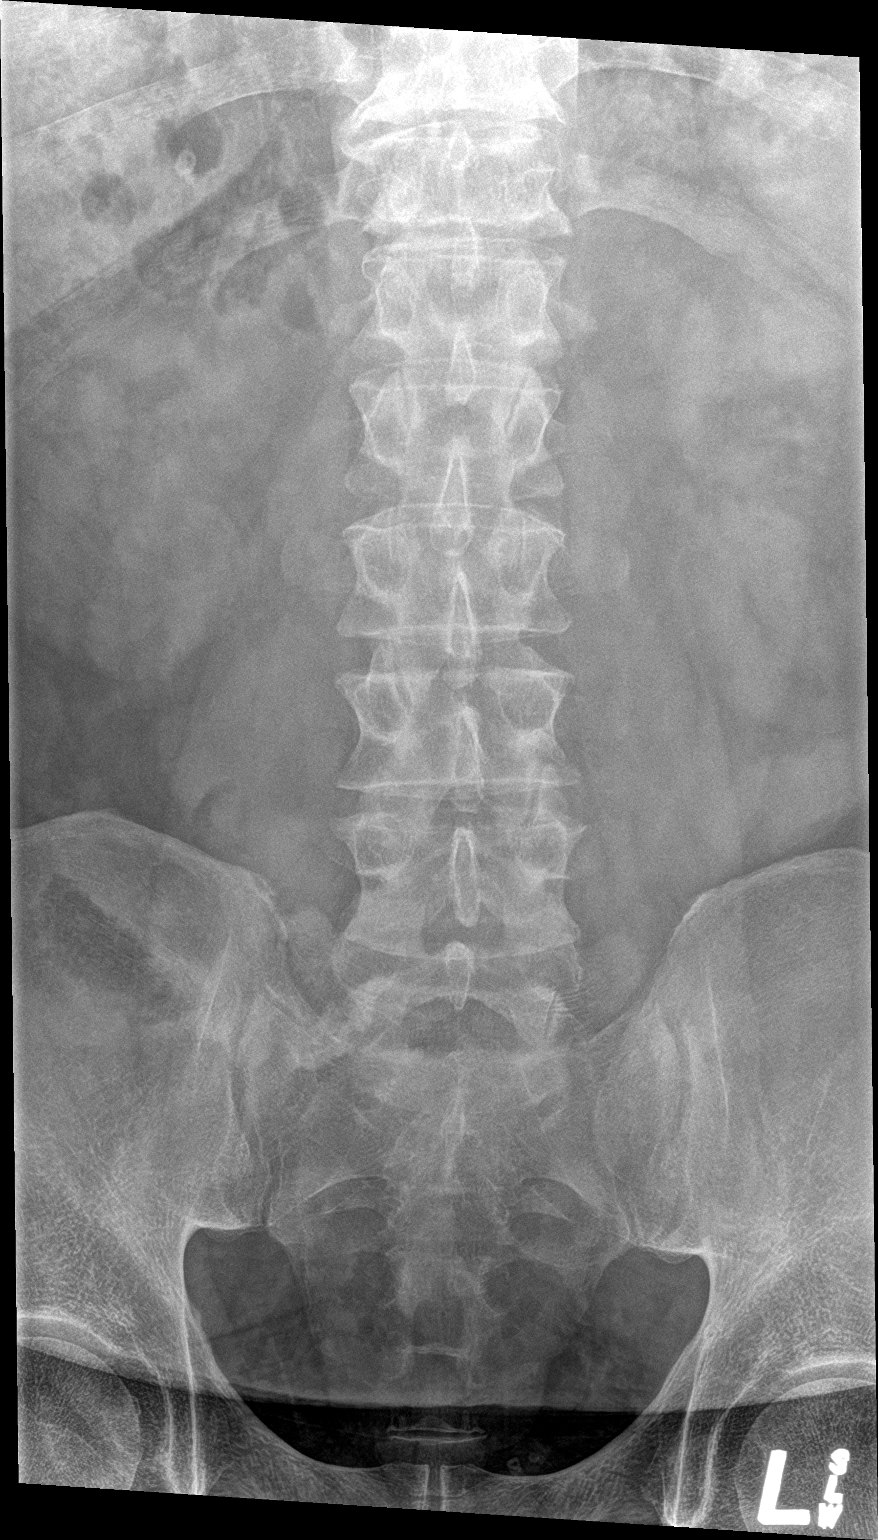

[l-spine lat]
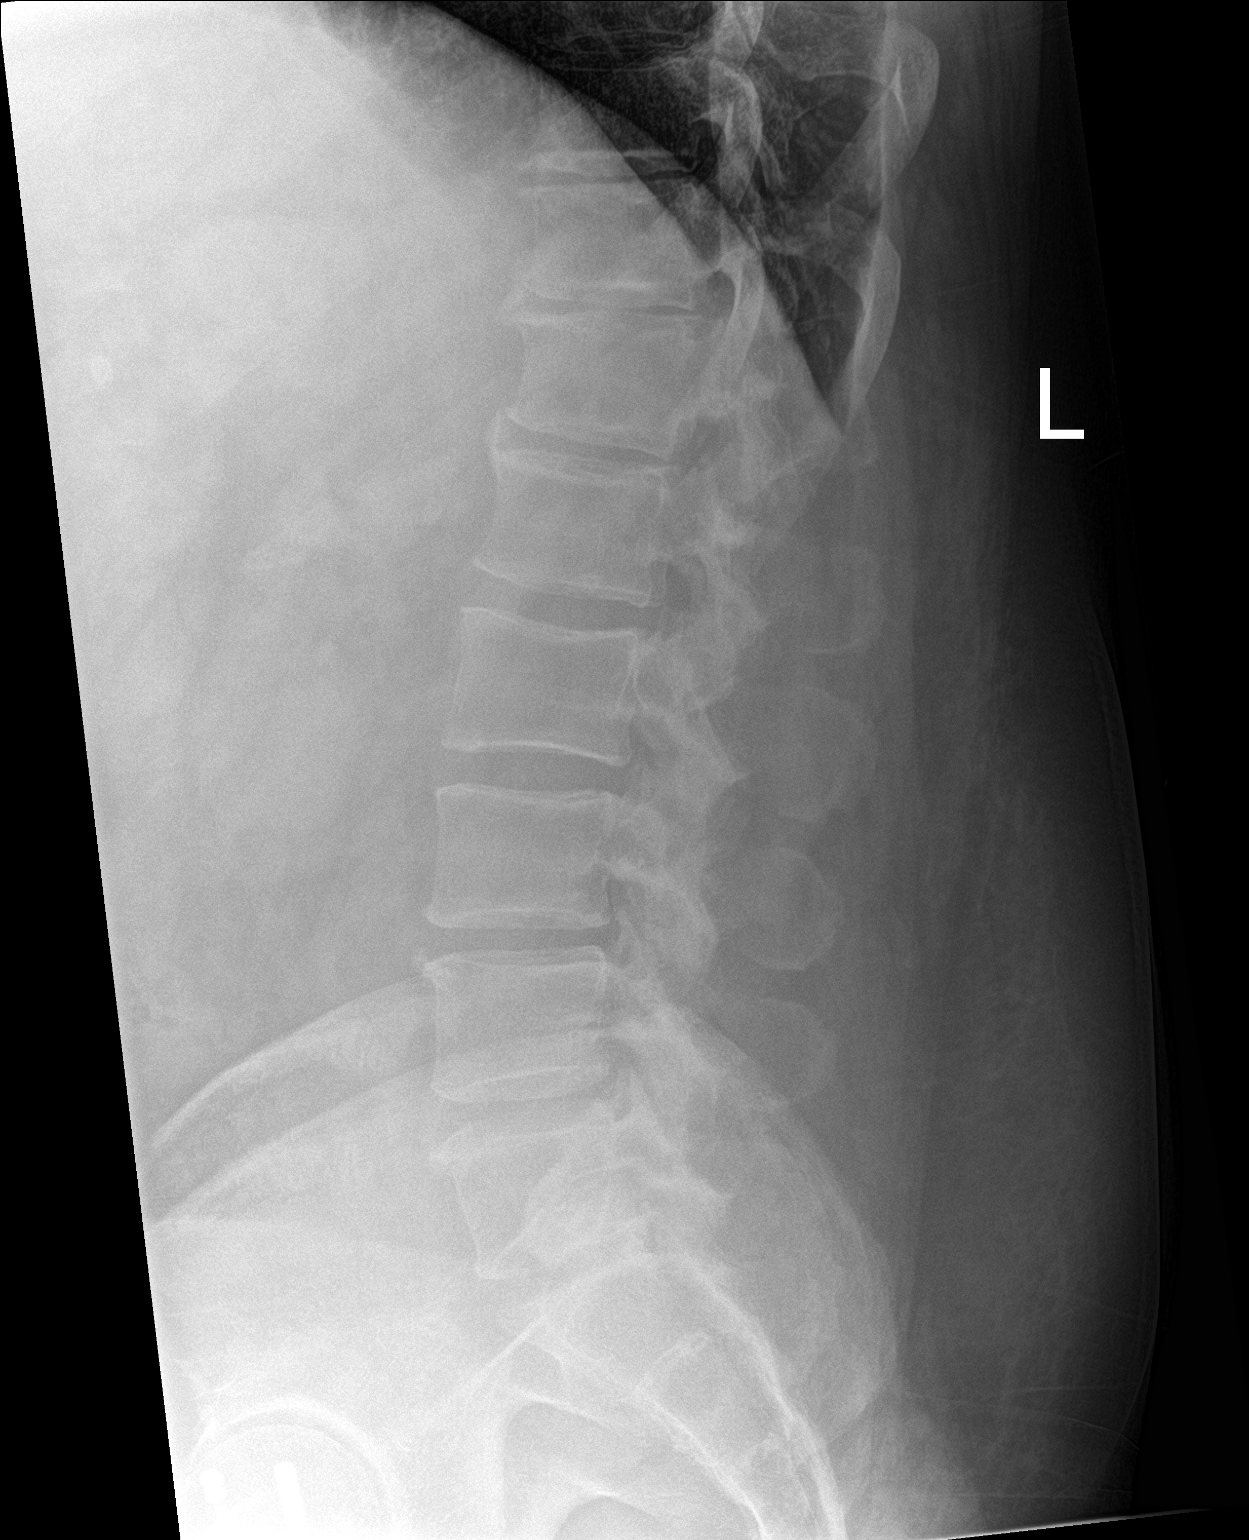

[l-spine spot]
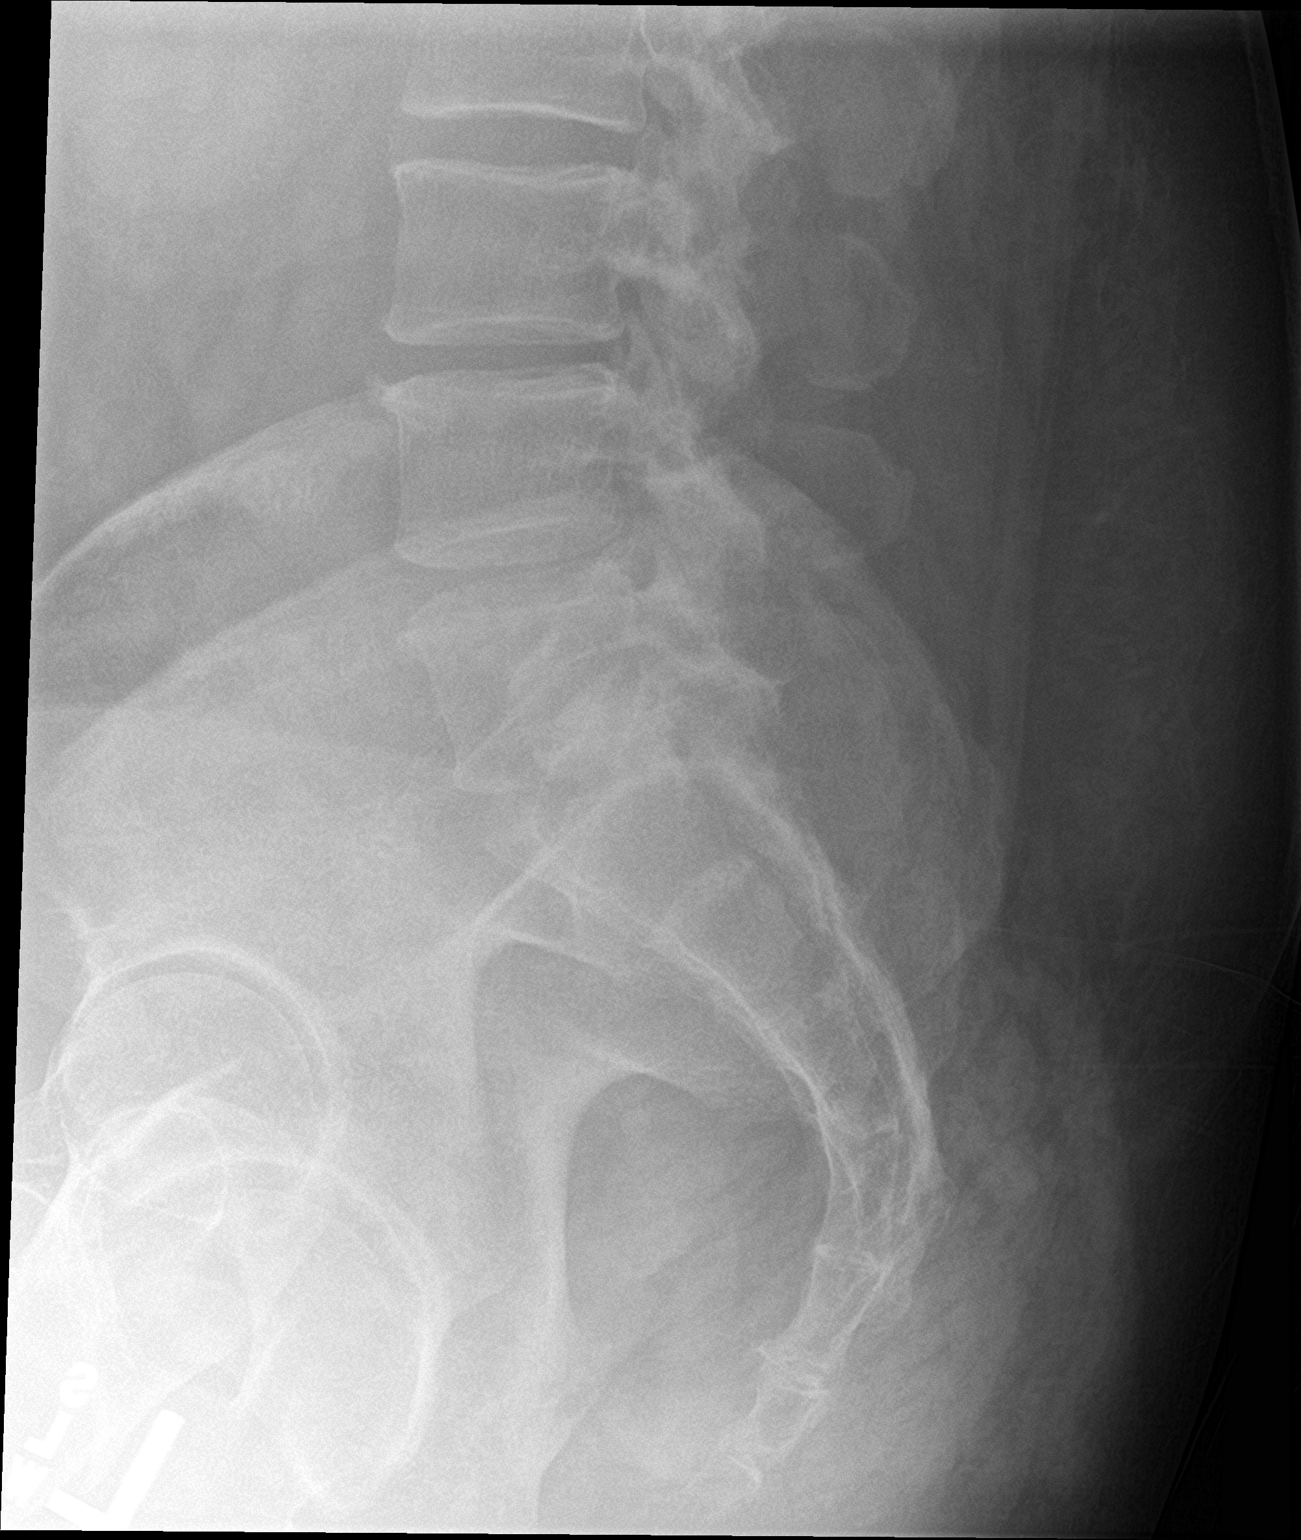

[3 of 3 positions shown; findings below may reference images not displayed]

FINDINGS: S1 is transitional. Alignment is normal. No disc space narrowing.
Mild lower lumbar facet osteoarthritis. Sacroiliac joints appear
normal.
IMPRESSION: No advanced finding.  Mild lower lumbar facet osteoarthritis.

## 2019-11-24 ENCOUNTER — Other Ambulatory Visit: Payer: Self-pay | Admitting: Osteopathic Medicine

## 2019-11-24 DIAGNOSIS — I10 Essential (primary) hypertension: Secondary | ICD-10-CM

## 2019-11-24 NOTE — Telephone Encounter (Signed)
Hasn't been seen in >1 year needs appt before authorization of additional refills

## 2019-11-26 IMAGING — CT CT ANGIO AOBIFEM WO/W CM
2 of 13 series · 11 of 46 positions shown, 15 images · IV contrast (APPLIED)
Comparison: None.

CLINICAL DATA: 47-year-old male with lower back and bilateral leg
pain,: S and paleness for the past 2 months.

EXAM:
CT ANGIOGRAPHY OF ABDOMINAL AORTA WITH ILIOFEMORAL RUNOFF
TECHNIQUE: Multidetector CT imaging of the abdomen, pelvis and lower
extremities was performed using the standard protocol during bolus
administration of intravenous contrast. Multiplanar CT image
reconstructions and MIPs were obtained to evaluate the vascular
anatomy.
CONTRAST:  125mL [id number] IOPAMIDOL ([id number]) INJECTION 76%

[Series 6: axial arterial · axial · arterial · 0.98mm/px · z∈[-742,+62]mm · 10 of 322 slices shown, 13 images]
[im 36/322  soft-tissue]
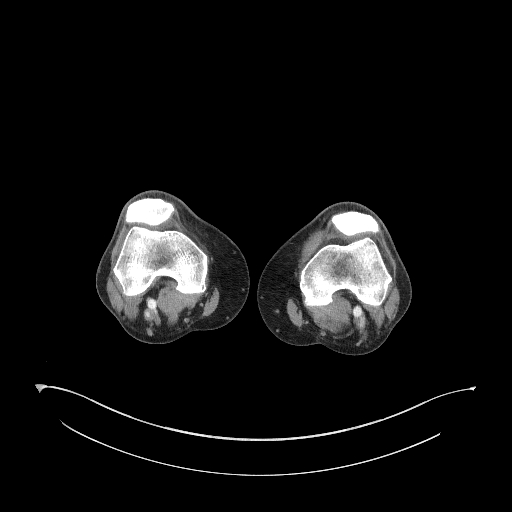
[im 36/322  bone]
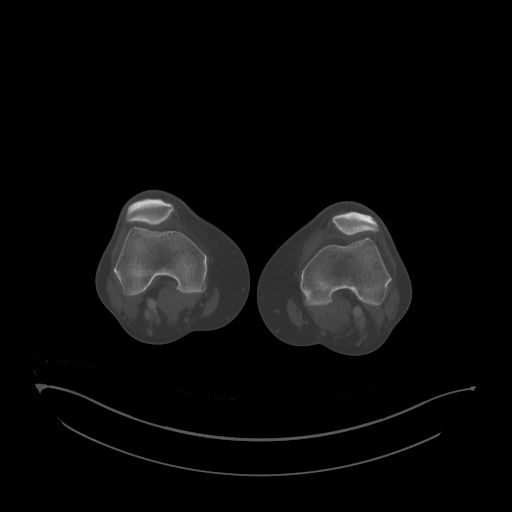
[im 72/322  soft-tissue]
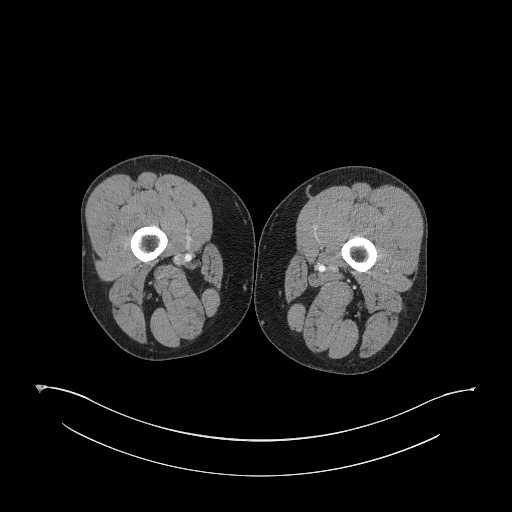
[im 108/322  soft-tissue]
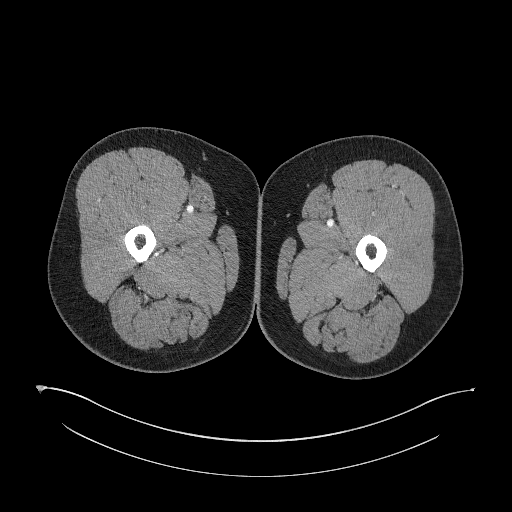
[im 143/322  soft-tissue]
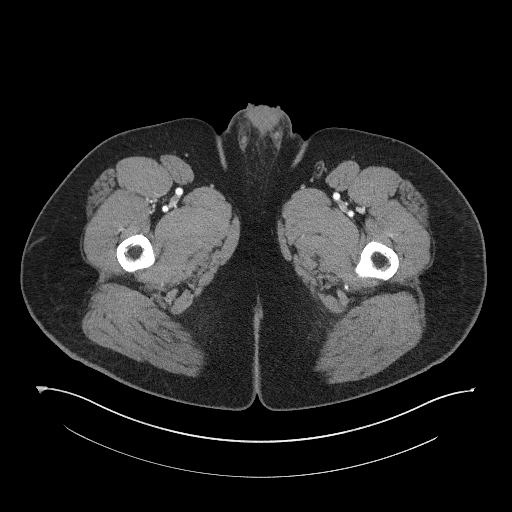
[im 179/322  soft-tissue]
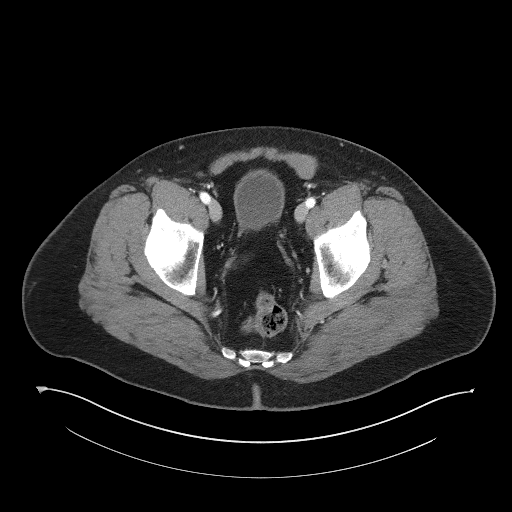
[im 215/322  soft-tissue]
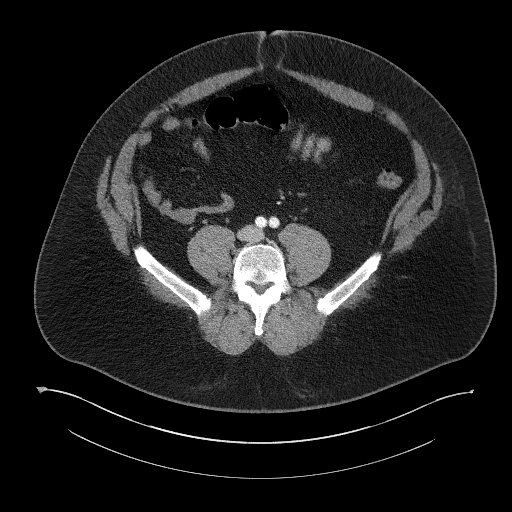
[im 250/322  soft-tissue]
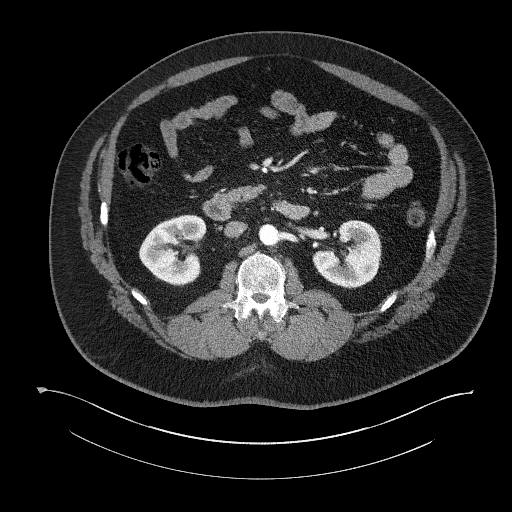
[im 250/322  lung]
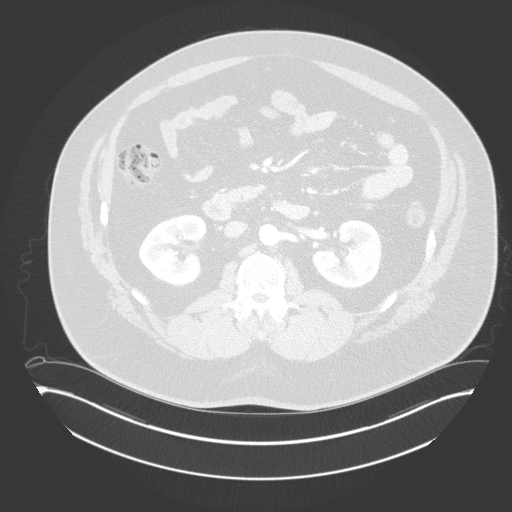
[im 268/322  lung]
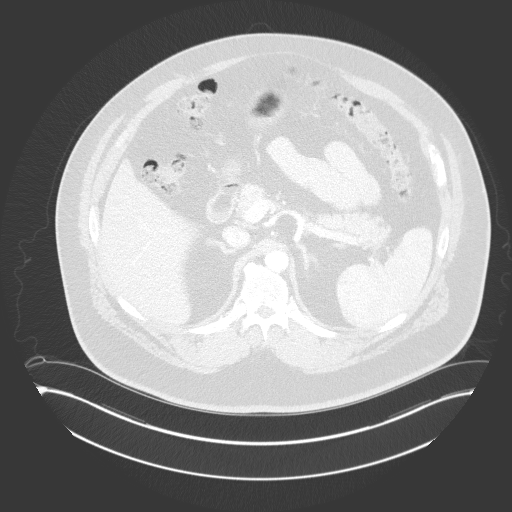
[im 286/322  soft-tissue]
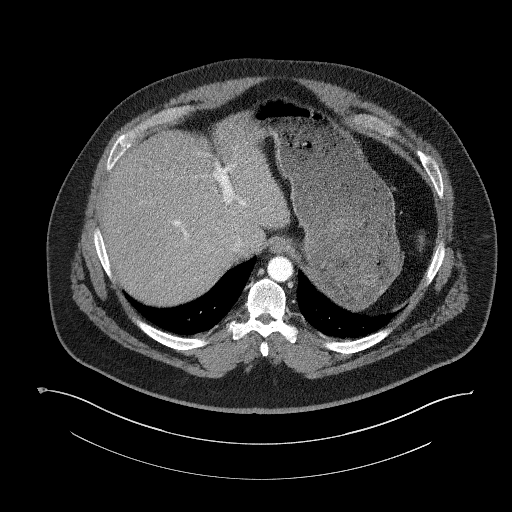
[im 286/322  lung]
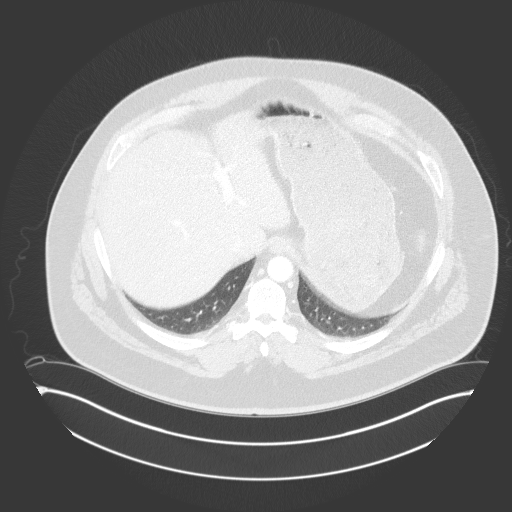
[im 304/322  lung]
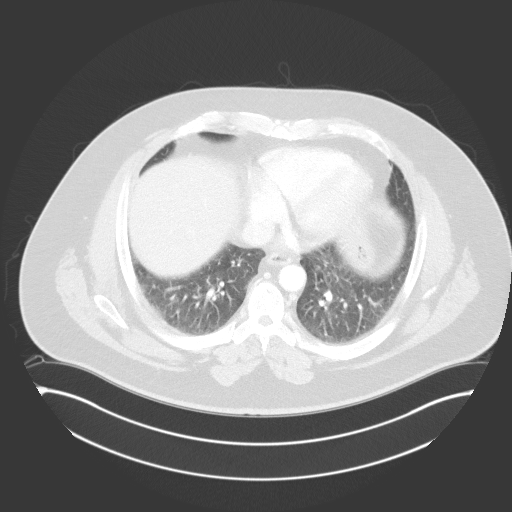

[Series 10: coronal upper · coronal · 0.93mm/px · 1 of 193 slices shown, 2 images]
[im 97/193  soft-tissue]
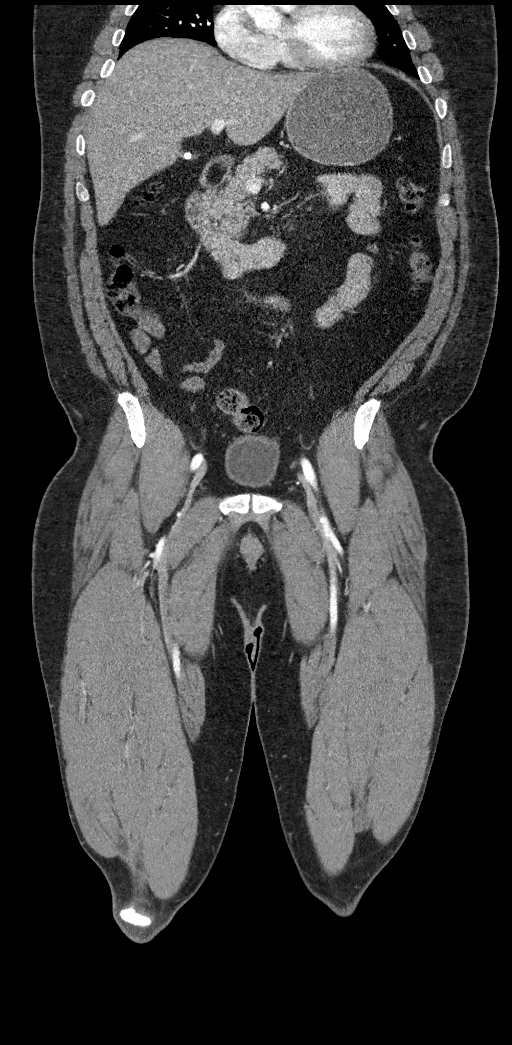
[im 97/193  bone]
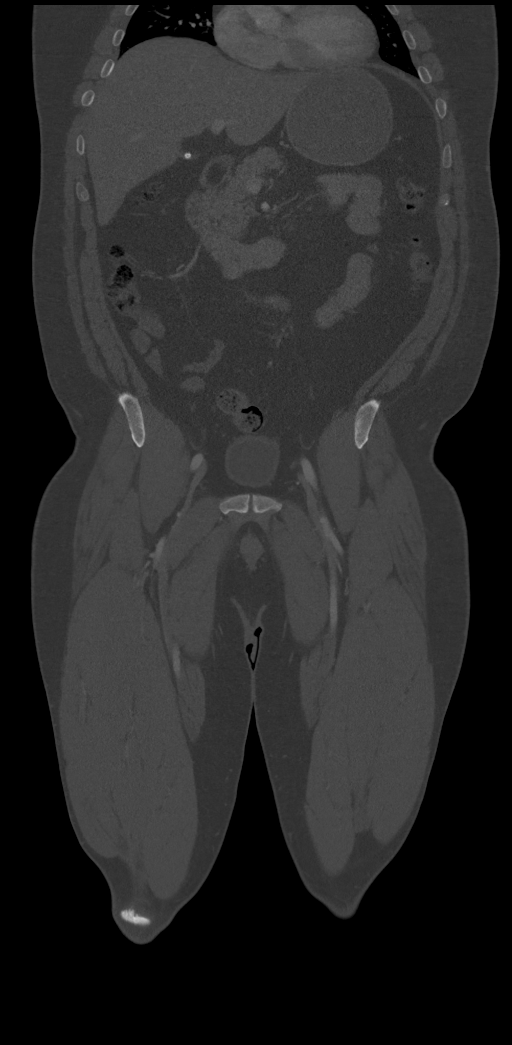

[11 of 46 positions shown; findings below may reference images not displayed]

FINDINGS: VASCULAR

Aorta: Normal caliber aorta without aneurysm, dissection, vasculitis
or significant stenosis.

Celiac: Patent without evidence of aneurysm, dissection, vasculitis
or significant stenosis.

SMA: Patent without evidence of aneurysm, dissection, vasculitis or
significant stenosis.

Renals: Both renal arteries are patent without evidence of aneurysm,
dissection, vasculitis, fibromuscular dysplasia or significant
stenosis.

IMA: Patent without evidence of aneurysm, dissection, vasculitis or
significant stenosis.

RIGHT Lower Extremity

Inflow: Common, internal and external iliac arteries are patent
without evidence of aneurysm, dissection, vasculitis or significant
stenosis.

Outflow: Common, superficial and profunda femoral arteries and the
popliteal artery are patent without evidence of aneurysm,
dissection, vasculitis or significant stenosis.

Runoff: Patent three vessel runoff to the ankle.

LEFT Lower Extremity

Inflow: Common, internal and external iliac arteries are patent
without evidence of aneurysm, dissection, vasculitis or significant
stenosis.

Outflow: Common, superficial and profunda femoral arteries and the
popliteal artery are patent without evidence of aneurysm,
dissection, vasculitis or significant stenosis.

Runoff: Patent three vessel runoff to the ankle.

Veins: No obvious venous abnormality within the limitations of this
arterial phase study.

Review of the MIP images confirms the above findings.

NON-VASCULAR

Lower chest: The lung bases are clear. Visualized cardiac structures
are within normal limits for size. No pericardial effusion.
Unremarkable visualized distal thoracic esophagus.

Hepatobiliary: Normal hepatic contour and morphology. No discrete
hepatic lesion. Small calcifications are present within the
gallbladder lumen consistent with cholelithiasis. No intra or
extrahepatic biliary ductal dilatation.

Pancreas: Unremarkable. No pancreatic ductal dilatation or
surrounding inflammatory changes.

Spleen: Normal in size without focal abnormality.

Adrenals/Urinary Tract: Adrenal glands are unremarkable. Kidneys are
normal, without renal calculi, focal lesion, or hydronephrosis.
Bladder is unremarkable.

Stomach/Bowel: Stomach is within normal limits. Appendix appears
normal. No evidence of bowel wall thickening, distention, or
inflammatory changes.

Lymphatic: No suspicious lymphadenopathy.

Reproductive: Prostate is unremarkable.

Other: Fat containing umbilical hernia.  No evidence of ascites.

Musculoskeletal: No acute fracture or aggressive appearing lytic or
blastic osseous lesion.
IMPRESSION: VASCULAR

1. Normal examination. No evidence of peripheral arterial disease,
stenosis, dissection or occlusion.

NON-VASCULAR

1. No acute abnormality within the abdomen or pelvis.
2. Cholelithiasis.
3. Fat containing umbilical hernia.

## 2019-12-17 ENCOUNTER — Other Ambulatory Visit: Payer: Self-pay | Admitting: Osteopathic Medicine

## 2019-12-17 DIAGNOSIS — I10 Essential (primary) hypertension: Secondary | ICD-10-CM

## 2019-12-17 NOTE — Telephone Encounter (Signed)
Please review for refill- patient at [facility] 

## 2020-01-26 ENCOUNTER — Other Ambulatory Visit: Payer: Self-pay | Admitting: Osteopathic Medicine

## 2020-01-26 DIAGNOSIS — I10 Essential (primary) hypertension: Secondary | ICD-10-CM

## 2020-02-19 ENCOUNTER — Other Ambulatory Visit: Payer: Self-pay | Admitting: Osteopathic Medicine

## 2020-02-19 DIAGNOSIS — I10 Essential (primary) hypertension: Secondary | ICD-10-CM

## 2020-02-19 NOTE — Telephone Encounter (Signed)
Requested  medications are  due for refill today no  Requested medications are on the active medication list yes  Last refill 3/16  Future visit scheduled no  Notes to clinic last visit in 2019

## 2020-02-20 NOTE — Telephone Encounter (Signed)
Ok to send 30 days  Patient needs appt, not seen since 08/2018

## 2020-02-25 ENCOUNTER — Other Ambulatory Visit: Payer: Self-pay

## 2020-02-25 DIAGNOSIS — I10 Essential (primary) hypertension: Secondary | ICD-10-CM

## 2020-02-25 MED ORDER — HYDROCHLOROTHIAZIDE 25 MG PO TABS: 25.0000 mg | ORAL_TABLET | Freq: Every day | ORAL | 0 refills | Status: DC

## 2020-02-25 NOTE — Telephone Encounter (Signed)
Pt's spouse called to schedule f/u appt for HTN.  Appt scheduled for 03/03/2020.  Spouse states that pt needs RF of HCTZ to last until appt.  RX sent to pharmacy for #7 only.  Tiajuana Amass, CMA

## 2020-03-03 ENCOUNTER — Encounter: Payer: Self-pay | Admitting: Osteopathic Medicine

## 2020-03-03 ENCOUNTER — Other Ambulatory Visit: Payer: Self-pay

## 2020-03-03 ENCOUNTER — Ambulatory Visit (INDEPENDENT_AMBULATORY_CARE_PROVIDER_SITE_OTHER): Payer: 59 | Admitting: Osteopathic Medicine

## 2020-03-03 VITALS — BP 130/83 | HR 65 | Temp 98.0°F | Wt 275.1 lb

## 2020-03-03 DIAGNOSIS — E782 Mixed hyperlipidemia: Secondary | ICD-10-CM

## 2020-03-03 DIAGNOSIS — Z0000 Encounter for general adult medical examination without abnormal findings: Secondary | ICD-10-CM | POA: Diagnosis not present

## 2020-03-03 DIAGNOSIS — I10 Essential (primary) hypertension: Secondary | ICD-10-CM | POA: Diagnosis not present

## 2020-03-03 MED ORDER — LOSARTAN POTASSIUM 25 MG PO TABS: 25.0000 mg | ORAL_TABLET | Freq: Every day | ORAL | 3 refills | Status: DC

## 2020-03-03 MED ORDER — HYDROCHLOROTHIAZIDE 25 MG PO TABS: 25.0000 mg | ORAL_TABLET | Freq: Every day | ORAL | 3 refills | Status: DC

## 2020-03-03 NOTE — Progress Notes (Signed)
HPI: Drew Kim is a 49 y.o. male who  has a past medical history of Acute nonsuppurative otitis media of left ear (02/29/2016), Bronchitis, Elevated BP (02/29/2016), and Obesity, Class II, BMI 35-39.9, no comorbidity (03/01/2016).  he presents to [facility] today, 03/03/20,  for chief complaint of: Annual physical FOllow up HTN     Patient here for annual physical / wellness exam.  See preventive care reviewed as below.   Additional concerns today include:  None   Past medical, surgical, social and family history reviewed:  Patient Active Problem List   Diagnosis Date Noted  . 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 Otitis, externa, infective 02/29/2016  . Acute nonsuppurative otitis media of left ear 02/29/2016  . Elevated BP 02/29/2016    No past surgical history on file.  Social History   Tobacco Use  . Smoking status: Never Smoker  . Smokeless tobacco: Never Used  Substance Use Topics  . Alcohol use: Yes    Alcohol/week: 0.0 standard drinks    Comment: OCC    No family history on file.   Current medication list and allergy/intolerance information reviewed:    Current Outpatient Medications  Medication Sig Dispense Refill  . hydrochlorothiazide (HYDRODIURIL) 25 MG tablet Take 1 tablet (25 mg total) by mouth daily. 90 tablet 3  . losartan (COZAAR) 25 MG tablet Take 1 tablet (25 mg total) by mouth daily. 90 tablet 3   No current facility-administered medications for this visit.    No Known Allergies    Review of Systems:  Constitutional:  No  fever, no chills, No recent illness  HEENT: No  headache, no vision change  Cardiac: No  chest pain, No  pressure, No palpitations  Respiratory:  No  shortness of breath. No  Cough  Gastrointestinal: No  abdominal pain, No  nausea, No  vomiting,  No  blood in stool, No  diarrhea, No  constipation   Musculoskeletal: No new myalgia/arthralgia  Skin: No   Rash  Hem/Onc: No  easy bruising/bleeding  Neurologic: No  weakness, No  dizziness,  Psychiatric: No  concerns with depression, No  concerns with anxiety,   Exam:  BP 130/83 (BP Location: Left Arm, Patient Position: Sitting, Cuff Size: Large)   Pulse 65   Temp 98 F (36.7 C) (Oral)   Wt 275 lb 1.9 oz (124.8 kg)   BMI 43.09 kg/m   Constitutional: VS see above. General Appearance: alert, well-developed, well-nourished, NAD  Eyes: Normal lids and conjunctive, non-icteric sclera  Ears, Nose, Mouth, Throat: . TM normal bilaterally.  Neck: No masses, trachea midline. No thyroid enlargement. No tenderness/mass appreciated. No lymphadenopathy  Respiratory: Normal respiratory effort. no wheeze, no rhonchi, no rales  Cardiovascular: S1/S2 normal, no murmur, no rub/gallop auscultated. RRR. No lower extremity edema.  Gastrointestinal: Nontender, no masses. No hepatomegaly, no splenomegaly. No hernia appreciated. Bowel sounds normal. Rectal exam deferred.   Musculoskeletal: Gait normal. No clubbing/cyanosis of digits.   Neurological: Normal balance/coordination. No tremor. No cranial nerve deficit on limited exam. Motor and sensation intact and symmetric. Cerebellar reflexes intact.   Skin: warm, dry, intact. No rash/ulcer. No concerning nevi or subq nodules on limited exam.    Psychiatric: Normal judgment/insight. Normal mood and affect. Oriented x3.    No results found for this or any previous visit (from the past 72 hour(s)).  No results found.   ASSESSMENT/PLAN: The primary encounter diagnosis was Annual physical exam. Diagnoses of Essential  hypertension, benign and Mixed hyperlipidemia were also pertinent to this visit.  Patient will think about Cologuard versus colonoscopy. Advised need labs done for medication safety/monitoring.  Patient will come back fasting tomorrow.  Orders Placed This Encounter  Procedures  . CBC  . COMPLETE METABOLIC PANEL WITH GFR  . Lipid  panel    Meds ordered this encounter  Medications  . hydrochlorothiazide (HYDRODIURIL) 25 MG tablet    Sig: Take 1 tablet (25 mg total) by mouth daily.    Dispense:  90 tablet    Refill:  3  . losartan (COZAAR) 25 MG tablet    Sig: Take 1 tablet (25 mg total) by mouth daily.    Dispense:  90 tablet    Refill:  3    Patient Instructions  General Preventive Care  Most recent routine screening labs: ordered.   Everyone should have blood pressure checked once per year. Goal 130/80 or less.   Tobacco: don't!   Alcohol: responsible moderation is ok for most adults - if you have concerns about your alcohol intake, please talk to me!   Exercise: as tolerated to reduce risk of cardiovascular disease and diabetes. Strength training will also prevent osteoporosis.   Mental health: if need for mental health care (medicines, counseling, other), or concerns about moods, please let me know!   Sexual health: if need for STD testing, or if concerns with libido/pain problems, please let me know! If you need to discuss your birth control options, please let me know!   Advanced Directive: Living Will and/or Healthcare Power of Attorney recommended for all adults, regardless of age or health.  Vaccines  Flu vaccine: for almost everyone, every fall.   Shingles vaccine: after age 33.   Pneumonia vaccines: after age 63  Tetanus booster: every 10 years.   COVID vaccine: as soon as you're eligible! Please follow [email or web address] for updates on eligibility and vaccination appointment. As of now, we do not anticipate our clinic having vaccines anytime soon.  Cancer screenings   Colon cancer screening: for everyone age 72-75. Colonoscopy available for all, many people also qualify for the Cologuard stool test   Prostate cancer screening: PSA blood test age 53-71   Lung cancer screening: not needed for non-smokers  Infection screenings  . HIV: recommended screening at least once age 65-65,  more often as needed. . Gonorrhea/Chlamydia: screening as needed . Hepatitis C: recommended once for everyone age 60-75 . TB: certain at-risk populations, or depending on work requirements and/or travel history Other . Bone Density Test: recommended for men at age 76 . Abdominal Aortic Aneurysm: screening with ultrasound recommended once for men age 64-75 who have ever smoked        Visit summary with medication list and pertinent instructions was printed for patient to review. All questions at time of visit were answered - patient instructed to contact office with any additional concerns or updates. ER/RTC precautions were reviewed with the patient.   Please note: voice recognition software was used to produce this document, and typos may escape review. Please contact Dr. Sheppard Coil for any needed clarifications.     Follow-up plan: Return in about 1 year (around 03/03/2021) for [place] (call week prior to visit for lab orders).

## 2020-03-03 NOTE — Patient Instructions (Signed)
General Preventive Care  Most recent routine screening labs: ordered.   Everyone should have blood pressure checked once per year. Goal 130/80 or less.   Tobacco: don't!   Alcohol: responsible moderation is ok for most adults - if you have concerns about your alcohol intake, please talk to me!   Exercise: as tolerated to reduce risk of cardiovascular disease and diabetes. Strength training will also prevent osteoporosis.   Mental health: if need for mental health care (medicines, counseling, other), or concerns about moods, please let me know!   Sexual health: if need for STD testing, or if concerns with libido/pain problems, please let me know! If you need to discuss your birth control options, please let me know!   Advanced Directive: Living Will and/or Healthcare Power of Attorney recommended for all adults, regardless of age or health.  Vaccines  Flu vaccine: for almost everyone, every fall.   Shingles vaccine: after age 65.   Pneumonia vaccines: after age 20  Tetanus booster: every 10 years.   COVID vaccine: as soon as you're eligible! Please follow [email or web address] for updates on eligibility and vaccination appointment. As of now, we do not anticipate our clinic having vaccines anytime soon.  Cancer screenings   Colon cancer screening: for everyone age 20-75. Colonoscopy available for all, many people also qualify for the Cologuard stool test   Prostate cancer screening: PSA blood test age 71-71   Lung cancer screening: not needed for non-smokers  Infection screenings  . HIV: recommended screening at least once age 8-65, more often as needed. . Gonorrhea/Chlamydia: screening as needed . Hepatitis C: recommended once for everyone age 26-75 . TB: certain at-risk populations, or depending on work requirements and/or travel history Other . Bone Density Test: recommended for men at age 17 . Abdominal Aortic Aneurysm: screening with ultrasound recommended once for men age  38-75 who have ever smoked

## 2020-06-08 ENCOUNTER — Telehealth (INDEPENDENT_AMBULATORY_CARE_PROVIDER_SITE_OTHER): Payer: 59 | Admitting: Nurse Practitioner

## 2020-06-08 ENCOUNTER — Encounter: Payer: Self-pay | Admitting: Nurse Practitioner

## 2020-06-08 DIAGNOSIS — W57XXXA Bitten or stung by nonvenomous insect and other nonvenomous arthropods, initial encounter: Secondary | ICD-10-CM | POA: Diagnosis not present

## 2020-06-08 DIAGNOSIS — I10 Essential (primary) hypertension: Secondary | ICD-10-CM

## 2020-06-08 MED ORDER — LOSARTAN POTASSIUM 25 MG PO TABS: 25.0000 mg | ORAL_TABLET | Freq: Every day | ORAL | 3 refills | Status: DC

## 2020-06-08 MED ORDER — HYDROCHLOROTHIAZIDE 25 MG PO TABS: 25.0000 mg | ORAL_TABLET | Freq: Every day | ORAL | 3 refills | Status: DC

## 2020-06-08 MED ORDER — DOXYCYCLINE HYCLATE 100 MG PO TABS: 100.0000 mg | ORAL_TABLET | Freq: Two times a day (BID) | ORAL | 0 refills | Status: DC

## 2020-06-08 NOTE — Progress Notes (Signed)
Virtual Video Visit via MyChart Note  I connected with  Drew Kim on 06/08/20 at  2:30 PM EDT by the video enabled telemedicine application for , MyChart, and verified that I am speaking with the correct person using two identifiers.   I introduced myself as a Publishing rights manager with the practice. We discussed the limitations of evaluation and management by telemedicine and the availability of in person appointments. The patient expressed understanding and agreed to proceed.  The patient is: at home I am: in the office  Subjective:    CC:  Chief Complaint  Patient presents with  . Fatigue    onset:2d, fatigue, chills, HA, feverish, feels slugish, found a tick 2 weeks ago, for about 3-4 days after removal the area was itching and irritated    HPI: Drew Kim is a 49 y.o. y/o male presenting via MyChart today for symptoms of fatigue, intermittent fever, chills, fatigue, headache, and for the past 2-3 days following a tick bite about 2 weeks ago. He reports that he found the tick in the [place] morning hours attached to his back and feels that it had been present for at least a day. He was able to remove the tick. He initially broke out in a rash with itching and swelling along with muscle aches and fatigue for about 2-3 days. He reports the rash resolved and he didn't think any more of it until the other symptoms started over the weekend.   He denies chest pain, shortness of breath, difficulty breathing, nausea, vomiting, or diarrhea.   He is also requesting refills on his blood pressure medications today. He reports he takes his blood pressure medication daily and checks his blood pressure at home and his numbers are well controlled. He denies chest pain, shortness of breath, palpitations, dizziness, or edema.   Past medical history, Surgical history, Family history not pertinant except as noted below, Social history, Allergies, and medications have been entered into the medical  record, reviewed, and corrections made.   Review of Systems:  See HPI for pertinent positive and negatives  Objective:    General: Speaking clearly in complete sentences without any shortness of breath.   Alert and oriented x3.   Normal judgment.  No apparent acute distress.   Impression and Recommendations:    1. Tick bite, initial encounter Symptoms and presentation consistent with illness associated with tick bite. It is unclear at this time how long the tick was present before removal or the type of tick that was removed. Considering the initial and ongoing symptoms, we will treat with 10 days of oral antibiotics for tick borne illness. We did discuss that he may need to have treatment for an additional 10 days if his symptoms do not resolved with the first round of treatment.   PLAN: - doxycycline (VIBRA-TABS) 100 MG tablet; Take 1 tablet (100 mg total) by mouth 2 (two) times daily.  Dispense: 20 tablet; Refill: 0 - Consider repeat dose of doxycycline for 10 days if symptoms have not resolved with first round of treatment.  - Follow-up if symptoms worsen or fail to improve.   2. Essential hypertension, benign He is requesting refills for his blood pressure medications today. He is taking his medication daily and his blood pressure is well controlled at home with his current regimen. Refills provided.  - hydrochlorothiazide (HYDRODIURIL) 25 MG tablet; Take 1 tablet (25 mg total) by mouth daily.  Dispense: 90 tablet; Refill: 3 - losartan (COZAAR) 25 MG tablet; Take  1 tablet (25 mg total) by mouth daily.  Dispense: 90 tablet; Refill: 3      I discussed the assessment and treatment plan with the patient. The patient was provided an opportunity to ask questions and all were answered. The patient agreed with the plan and demonstrated an understanding of the instructions.   The patient was advised to call back or seek an in-person evaluation if the symptoms worsen or if the condition  fails to improve as anticipated.  I provided 20 minutes of non-face-to-face interaction with this MYCHART visit including intake, same-day documentation, and chart review.   Tollie Eth, NP

## 2021-08-31 ENCOUNTER — Other Ambulatory Visit: Payer: Self-pay | Admitting: Nurse Practitioner

## 2021-08-31 DIAGNOSIS — I10 Essential (primary) hypertension: Secondary | ICD-10-CM

## 2021-09-05 ENCOUNTER — Encounter: Payer: Self-pay | Admitting: Family Medicine

## 2021-09-05 ENCOUNTER — Ambulatory Visit (INDEPENDENT_AMBULATORY_CARE_PROVIDER_SITE_OTHER): Payer: 59 | Admitting: Family Medicine

## 2021-09-05 VITALS — BP 138/87 | HR 62 | Temp 98.2°F | Wt 288.0 lb

## 2021-09-05 DIAGNOSIS — Z13220 Encounter for screening for lipoid disorders: Secondary | ICD-10-CM

## 2021-09-05 DIAGNOSIS — Z1211 Encounter for screening for malignant neoplasm of colon: Secondary | ICD-10-CM

## 2021-09-05 DIAGNOSIS — I10 Essential (primary) hypertension: Secondary | ICD-10-CM | POA: Diagnosis not present

## 2021-09-05 MED ORDER — HYDROCHLOROTHIAZIDE 25 MG PO TABS: 25.0000 mg | ORAL_TABLET | Freq: Every day | ORAL | 3 refills | Status: DC

## 2021-09-05 MED ORDER — LOSARTAN POTASSIUM 25 MG PO TABS: 25.0000 mg | ORAL_TABLET | Freq: Every day | ORAL | 3 refills | Status: DC

## 2021-09-05 NOTE — Patient Instructions (Signed)
Blood pressure is at goal at for age and co-morbidities.  I recommend continuing current regimen.  In addition they were instructed on the following: - BP goal <130/80 - monitor and log blood pressures at home - check around the same time each day in a relaxed setting - Limit salt to <2000 mg/day - Follow DASH eating plan (heart healthy diet) - limit alcohol to 2 standard drinks per day for men and 1 per day for women - avoid tobacco products - get at least 2 hours of regular aerobic exercise weekly Patient aware of signs/symptoms requiring further/urgent evaluation. Labs updated today.

## 2021-09-05 NOTE — Progress Notes (Signed)
Established Patient Office Visit  Subjective:  Patient ID: Drew Kim, male    DOB: [birth year]  Age: 50 y.o. MRN: [id number]  CC:  Chief Complaint  Patient presents with   Hypertension   Medication Refill    HPI Adyan Palau presents for HTN follow-up.   HYPERTENSION: - Medications: losartan 25 mg, HCTZ 25 mg - Compliance: ran out last week (maybe 4 days without), otherwise compliant - Checking BP at home: 120-130s/70s-80s - Denies any SOB, recurrent headaches, CP, vision changes, LE edema, dizziness, palpitations, or medication side effects. - Diet: "good be better," tries for low-sodium - Exercise: landscaping job, constant movement - Stressors: none   Past Medical History:  Diagnosis Date   Acute nonsuppurative otitis media of left ear 02/29/2016   Bronchitis    Elevated BP 02/29/2016   Obesity, Class II, BMI 35-39.9, no comorbidity 03/01/2016    History reviewed. No pertinent surgical history.  History reviewed. No pertinent family history.  Social History   Socioeconomic History   Marital status: Single    Spouse name: Not on file   Number of children: Not on file   Years of education: Not on file   Highest education level: Not on file  Occupational History   Not on file  Tobacco Use   Smoking status: Never   Smokeless tobacco: Never  Vaping Use   Vaping Use: Never used  Substance and Sexual Activity   Alcohol use: Yes    Alcohol/week: 0.0 standard drinks    Comment: OCC   Drug use: Never   Sexual activity: Not on file  Other Topics Concern   Not on file  Social History Narrative   Not on file   Social Determinants of Health   Financial Resource Strain: Not on file  Food Insecurity: Not on file  Transportation Needs: Not on file  Physical Activity: Not on file  Stress: Not on file  Social Connections: Not on file  Intimate Partner Violence: Not on file    Outpatient Medications Prior to Visit  Medication Sig Dispense Refill    hydrochlorothiazide (HYDRODIURIL) 25 MG tablet Take 1 tablet (25 mg total) by mouth daily. 90 tablet 3   losartan (COZAAR) 25 MG tablet Take 1 tablet (25 mg total) by mouth daily. 90 tablet 3   doxycycline (VIBRA-TABS) 100 MG tablet Take 1 tablet (100 mg total) by mouth 2 (two) times daily. (Patient not taking: Reported on 09/05/2021) 20 tablet 0   No facility-administered medications prior to visit.    No Known Allergies  ROS Review of Systems All review of systems negative except what is listed in the HPI    Objective:    Physical Exam Vitals reviewed.  Constitutional:      Appearance: Normal appearance.  HENT:     Head: Normocephalic and atraumatic.  Cardiovascular:     Rate and Rhythm: Normal rate and regular rhythm.     Pulses: Normal pulses.     Heart sounds: Normal heart sounds.  Pulmonary:     Effort: Pulmonary effort is normal.     Breath sounds: Normal breath sounds.  Musculoskeletal:     Right lower leg: No edema.     Left lower leg: No edema.  Skin:    General: Skin is warm and dry.     Capillary Refill: Capillary refill takes less than 2 seconds.  Neurological:     Mental Status: He is alert and oriented to person, place, and time.  Psychiatric:  Mood and Affect: Mood normal.        Behavior: Behavior normal.        Thought Content: Thought content normal.        Judgment: Judgment normal.    BP 138/87 (BP Location: Left Arm, Patient Position: Sitting, Cuff Size: Large)   Pulse 62   Temp 98.2 F (36.8 C) (Oral)   Wt 288 lb (130.6 kg)   BMI 45.11 kg/m  Wt Readings from Last 3 Encounters:  09/05/21 288 lb (130.6 kg)  03/03/20 275 lb 1.9 oz (124.8 kg)  11/09/19 270 lb (122.5 kg)     Health Maintenance Due  Topic Date Due   COVID-19 Vaccine (1) Never done   HIV Screening  Never done   Hepatitis C Screening  Never done   COLONOSCOPY (Pts 45-62yrs Insurance coverage will need to be confirmed)  Never done   Zoster Vaccines- Shingrix (1 of 2)  Never done   INFLUENZA VACCINE  Never done    There are no preventive care reminders to display for this patient.  Lab Results  Component Value Date   TSH 0.81 07/26/2018   Lab Results  Component Value Date   WBC 6.7 07/26/2018   HGB 15.1 07/26/2018   HCT 43.5 07/26/2018   MCV 88.1 07/26/2018   PLT 182 07/26/2018   Lab Results  Component Value Date   NA 139 07/26/2018   K 4.1 07/26/2018   CO2 24 07/26/2018   GLUCOSE 96 07/26/2018   BUN 15 07/26/2018   CREATININE 0.82 07/26/2018   BILITOT 1.0 07/26/2018   AST 17 07/26/2018   ALT 23 07/26/2018   PROT 7.4 07/26/2018   CALCIUM 9.4 07/26/2018   Lab Results  Component Value Date   CHOL 240 (H) 07/26/2018   Lab Results  Component Value Date   HDL 47 07/26/2018   Lab Results  Component Value Date   LDLCALC 158 (H) 07/26/2018   Lab Results  Component Value Date   TRIG 195 (H) 07/26/2018   Lab Results  Component Value Date   CHOLHDL 5.1 (H) 07/26/2018   No results found for: HGBA1C    Assessment & Plan:   Problem List Items Addressed This Visit       Cardiovascular and Mediastinum   Essential hypertension, benign - Primary    Blood pressure is at goal at for age and co-morbidities.  I recommend continuing current regimen.  In addition they were instructed on the following: - BP goal <130/80 - monitor and log blood pressures at home - check around the same time each day in a relaxed setting - Limit salt to <2000 mg/day - Follow DASH eating plan (heart healthy diet) - limit alcohol to 2 standard drinks per day for men and 1 per day for women - avoid tobacco products - get at least 2 hours of regular aerobic exercise weekly Patient aware of signs/symptoms requiring further/urgent evaluation. Labs updated today.      Relevant Medications   hydrochlorothiazide (HYDRODIURIL) 25 MG tablet   losartan (COZAAR) 25 MG tablet   Other Relevant Orders   COMPLETE METABOLIC PANEL WITH GFR   Lipid panel   CBC    Other Visit Diagnoses     Screen for colon cancer       Relevant Orders   Cologuard   Screening, lipid       Relevant Orders   Lipid panel       Meds ordered this encounter  Medications  hydrochlorothiazide (HYDRODIURIL) 25 MG tablet    Sig: Take 1 tablet (25 mg total) by mouth daily.    Dispense:  90 tablet    Refill:  3   losartan (COZAAR) 25 MG tablet    Sig: Take 1 tablet (25 mg total) by mouth daily.    Dispense:  90 tablet    Refill:  3     Follow-up: Return in about 6 months (around 03/06/2022) for est with PCP, HTN f/u.    Clayborne Dana, NP

## 2021-09-05 NOTE — Assessment & Plan Note (Signed)
Blood pressure is at goal at for age and co-morbidities.  I recommend continuing current regimen.  In addition they were instructed on the following: - BP goal <130/80 - monitor and log blood pressures at home - check around the same time each day in a relaxed setting - Limit salt to <2000 mg/day - Follow DASH eating plan (heart healthy diet) - limit alcohol to 2 standard drinks per day for men and 1 per day for women - avoid tobacco products - get at least 2 hours of regular aerobic exercise weekly Patient aware of signs/symptoms requiring further/urgent evaluation. Labs updated today.  

## 2022-03-06 ENCOUNTER — Ambulatory Visit: Payer: 59 | Admitting: Family Medicine

## 2022-03-15 ENCOUNTER — Ambulatory Visit: Payer: 59 | Admitting: Family Medicine

## 2022-03-28 ENCOUNTER — Encounter: Payer: Self-pay | Admitting: Family Medicine

## 2022-03-28 DIAGNOSIS — I10 Essential (primary) hypertension: Secondary | ICD-10-CM

## 2022-03-29 MED ORDER — HYDROCHLOROTHIAZIDE 25 MG PO TABS: 25.0000 mg | ORAL_TABLET | Freq: Every day | ORAL | 0 refills | Status: DC

## 2022-03-29 MED ORDER — LOSARTAN POTASSIUM 25 MG PO TABS: 25.0000 mg | ORAL_TABLET | Freq: Every day | ORAL | 0 refills | Status: DC

## 2022-04-13 ENCOUNTER — Ambulatory Visit (INDEPENDENT_AMBULATORY_CARE_PROVIDER_SITE_OTHER): Payer: PRIVATE HEALTH INSURANCE | Admitting: Family Medicine

## 2022-04-13 ENCOUNTER — Encounter: Payer: Self-pay | Admitting: Family Medicine

## 2022-04-13 VITALS — BP 129/79 | HR 76 | Ht 67.0 in | Wt 274.0 lb

## 2022-04-13 DIAGNOSIS — Z1211 Encounter for screening for malignant neoplasm of colon: Secondary | ICD-10-CM | POA: Diagnosis not present

## 2022-04-13 DIAGNOSIS — Z13220 Encounter for screening for lipoid disorders: Secondary | ICD-10-CM

## 2022-04-13 DIAGNOSIS — M25512 Pain in left shoulder: Secondary | ICD-10-CM

## 2022-04-13 DIAGNOSIS — I10 Essential (primary) hypertension: Secondary | ICD-10-CM

## 2022-04-13 MED ORDER — PREDNISONE 50 MG PO TABS: ORAL_TABLET | ORAL | 0 refills | Status: DC

## 2022-04-13 NOTE — Assessment & Plan Note (Addendum)
Blood pressure remains well controlled at this time.  Recommend continuation of current medications.  We discussed that no further refills will be provided until labs are completed. ?

## 2022-04-13 NOTE — Assessment & Plan Note (Signed)
Adding course of prednisone.  Continue to work on range of motion exercises.  If not improving recommend follow-up. ?

## 2022-04-13 NOTE — Progress Notes (Signed)
?Drew Kim - 51 y.o. male MRN [id number]  Date of birth: 1971-02-03 ? ?Subjective ?Chief Complaint  ?Patient presents with  ? Hypertension  ? ? ?HPI ?Drew Kim is a 51 year old male here today for follow-up visit.  He is a former patient of Dr. Sheppard Coil.  Has history of hypertension that has been well controlled with combination of losartan and hydrochlorothiazide.  He is tolerating medication well without side effects.  He is overdue for lab work at this time.  Not fasting today and plans to come back. ? ?He is having some left shoulder pain.  Possibly some overuse related to his job.  Denies radicular symptoms. ? ?ROS:  A comprehensive ROS was completed and negative except as noted per HPI ? ?No Known Allergies ? ?Past Medical History:  ?Diagnosis Date  ? Acute nonsuppurative otitis media of left ear 02/29/2016  ? Bronchitis   ? Elevated BP 02/29/2016  ? Obesity, Class II, BMI 35-39.9, no comorbidity 03/01/2016  ? ? ?History reviewed. No pertinent surgical history. ? ?Social History  ? ?Socioeconomic History  ? Marital status: Single  ?  Spouse name: Not on file  ? Number of children: Not on file  ? Years of education: Not on file  ? Highest education level: Not on file  ?Occupational History  ? Not on file  ?Tobacco Use  ? Smoking status: Never  ? Smokeless tobacco: Never  ?Vaping Use  ? Vaping Use: Never used  ?Substance and Sexual Activity  ? Alcohol use: Yes  ?  Alcohol/week: 0.0 standard drinks  ?  Comment: OCC  ? Drug use: Never  ? Sexual activity: Not on file  ?Other Topics Concern  ? Not on file  ?Social History Narrative  ? Not on file  ? ?Social Determinants of Health  ? ?Financial Resource Strain: Not on file  ?Food Insecurity: Not on file  ?Transportation Needs: Not on file  ?Physical Activity: Not on file  ?Stress: Not on file  ?Social Connections: Not on file  ? ? ?History reviewed. No pertinent family history. ? ?Health Maintenance  ?Topic Date Due  ? COVID-19 Vaccine (1) Never done  ? HIV  Screening  Never done  ? Hepatitis C Screening  Never done  ? COLONOSCOPY (Pts 45-47yrs Insurance coverage will need to be confirmed)  Never done  ? Zoster Vaccines- Shingrix (1 of 2) Never done  ? INFLUENZA VACCINE  07/04/2022  ? TETANUS/TDAP  06/22/2027  ? HPV VACCINES  Aged Out  ? ? ? ?----------------------------------------------------------------------------------------------------------------------------------------------------------------------------------------------------------------- ?Physical Exam ?BP 129/79 (BP Location: Left Arm, Patient Position: Sitting, Cuff Size: Large)   Pulse 76   Ht 5\' 7"  (1.702 m)   Wt 274 lb (124.3 kg)   SpO2 96%   BMI 42.91 kg/m?  ? ?Physical Exam ?Constitutional:   ?   Appearance: Normal appearance.  ?Eyes:  ?   General: No scleral icterus. ?Cardiovascular:  ?   Rate and Rhythm: Normal rate and regular rhythm.  ?Neurological:  ?   Mental Status: He is alert.  ?Psychiatric:     ?   Mood and Affect: Mood normal.     ?   Behavior: Behavior normal.  ? ? ?------------------------------------------------------------------------------------------------------------------------------------------------------------------------------------------------------------------- ?Assessment and Plan ? ?Essential hypertension, benign ?Blood pressure remains well controlled at this time.  Recommend continuation of current medications.  We discussed that no further refills will be provided until labs are completed. ? ?Left shoulder pain ?Adding course of prednisone.  Continue to work on range of motion exercises.  If not improving recommend follow-up. ? ? ?Meds ordered this encounter  ?Medications  ? predniSONE (DELTASONE) 50 MG tablet  ?  Sig: Take 1 tab po daily x5 days.  ?  Dispense:  5 tablet  ?  Refill:  0  ? ? ?Return in about 6 months (around 10/14/2022) for HTN. ? ? ? ?This visit occurred during the SARS-CoV-2 public health emergency.  Safety protocols were in place, including  screening questions prior to the visit, additional usage of staff PPE, and extensive cleaning of exam room while observing appropriate contact time as indicated for disinfecting solutions.  ? ?

## 2022-04-13 NOTE — Patient Instructions (Addendum)
Continue current medications.  ?Have labs completed when fasting.  ?Try prednisone for shoulder pain.  ?See me again in 6 months.  ? ?

## 2022-06-05 ENCOUNTER — Other Ambulatory Visit: Payer: Self-pay | Admitting: Family Medicine

## 2022-06-05 DIAGNOSIS — I10 Essential (primary) hypertension: Secondary | ICD-10-CM

## 2022-08-16 ENCOUNTER — Encounter: Payer: Self-pay | Admitting: Physician Assistant

## 2022-08-16 ENCOUNTER — Ambulatory Visit (INDEPENDENT_AMBULATORY_CARE_PROVIDER_SITE_OTHER): Payer: No Typology Code available for payment source | Admitting: Physician Assistant

## 2022-08-16 ENCOUNTER — Encounter: Payer: Self-pay | Admitting: Family Medicine

## 2022-08-16 ENCOUNTER — Other Ambulatory Visit: Payer: Self-pay | Admitting: Physician Assistant

## 2022-08-16 VITALS — BP 140/79 | HR 65 | Temp 98.1°F | Resp 20 | Ht 67.0 in | Wt 274.1 lb

## 2022-08-16 DIAGNOSIS — R1013 Epigastric pain: Secondary | ICD-10-CM

## 2022-08-16 DIAGNOSIS — Z8379 Family history of other diseases of the digestive system: Secondary | ICD-10-CM | POA: Insufficient documentation

## 2022-08-16 DIAGNOSIS — R52 Pain, unspecified: Secondary | ICD-10-CM

## 2022-08-16 DIAGNOSIS — I10 Essential (primary) hypertension: Secondary | ICD-10-CM

## 2022-08-16 DIAGNOSIS — R110 Nausea: Secondary | ICD-10-CM | POA: Diagnosis not present

## 2022-08-16 MED ORDER — OMEPRAZOLE 40 MG PO CPDR: 40.0000 mg | DELAYED_RELEASE_CAPSULE | Freq: Every day | ORAL | 1 refills | Status: DC

## 2022-08-16 MED ORDER — SUCRALFATE 1 G PO TABS: 1.0000 g | ORAL_TABLET | Freq: Four times a day (QID) | ORAL | 0 refills | Status: DC

## 2022-08-16 NOTE — Progress Notes (Signed)
Acute Office Visit  Subjective:     Patient ID: Drew Kim, male    DOB: [birth year], 51 y.o.   MRN: [id number]  Chief Complaint  Patient presents with   Abdominal Pain    HPI Patient is in today for epigastric pain and nausea for the past few days that is worsening. He denies any history of GERD. He does not drink alcohol. He is not taking NSAIDs. She still has his gallbladder. Denies any vomiting, fever, or chills. His stools are normal in color and consistency. Gastric ulcers do run in his family. He has not started any new medications. He denies any CP or palpitations. Pain can be pretty severe and worse after eating. Yesterday his pain was better but he ate a ham sandwich and it started back.  .. Active Ambulatory Problems    Diagnosis Date Noted   Obesity, Class II, BMI 35-39.9, no comorbidity 03/01/2016   Essential hypertension, benign 09/05/2021   Left shoulder pain 04/13/2022   Family history of gastric ulcer 08/16/2022   Nausea 08/16/2022   Epigastric abdominal pain 08/16/2022   Pain aggravated by eating or drinking 08/16/2022   Resolved Ambulatory Problems    Diagnosis Date Noted   Otitis, externa, infective 02/29/2016   Acute nonsuppurative otitis media of left ear 02/29/2016   Elevated BP 02/29/2016   Past Medical History:  Diagnosis Date   Bronchitis       ROS See HPI.      Objective:    BP (!) 140/79 (BP Location: Right Arm, Cuff Size: Large)   Pulse 65   Temp 98.1 F (36.7 C)   Resp 20   Ht 5\' 7"  (1.702 m)   Wt 274 lb 1.3 oz (124.3 kg)   SpO2 96%   BMI 42.93 kg/m  BP Readings from Last 3 Encounters:  08/16/22 (!) 140/79  04/13/22 129/79  09/05/21 138/87   Wt Readings from Last 3 Encounters:  08/16/22 274 lb 1.3 oz (124.3 kg)  04/13/22 274 lb (124.3 kg)  09/05/21 288 lb (130.6 kg)      Physical Exam Constitutional:      Appearance: Normal appearance. He is obese.  HENT:     Head: Normocephalic.  Cardiovascular:     Rate and  Rhythm: Normal rate and regular rhythm.     Pulses: Normal pulses.     Heart sounds: Normal heart sounds.  Pulmonary:     Effort: Pulmonary effort is normal.     Breath sounds: Normal breath sounds.  Abdominal:     General: Bowel sounds are normal. There is no distension.     Palpations: Abdomen is soft. There is no mass.     Tenderness: There is abdominal tenderness. There is no right CVA tenderness, left CVA tenderness, guarding or rebound.     Hernia: A hernia is present.     Comments: Large ventral hernia Moderate epigastric tenderness to palpation  Musculoskeletal:     Right lower leg: No edema.     Left lower leg: No edema.  Neurological:     General: No focal deficit present.     Mental Status: He is alert.  Psychiatric:        Mood and Affect: Mood normal.     EKG: sinus bradycardia with no arrhthymias. LVH with no signs of ST elevation or depression.       Assessment & Plan:  12/03/22Marland KitchenHussain was seen today for abdominal pain.  Diagnoses and all orders for this visit:  Epigastric abdominal pain -     H. pylori breath test -     COMPLETE METABOLIC PANEL WITH GFR -     CBC w/Diff/Platelet -     Lipase -     US Abdomen Complete; Future -     omeprazole (PRILOSEC) 40 MG capsule; Take 1 capsule (40 mg total) by mouth daily. -     sucralfate (CARAFATE) 1 g tablet; Take 1 tablet (1 g total) by mouth 4 (four) times daily.  Nausea -     H. pylori breath test -     COMPLETE METABOLIC PANEL WITH GFR -     CBC w/Diff/Platelet -     Lipase -     US Abdomen Complete; Future -     omeprazole (PRILOSEC) 40 MG capsule; Take 1 capsule (40 mg total) by mouth daily. -     sucralfate (CARAFATE) 1 g tablet; Take 1 tablet (1 g total) by mouth 4 (four) times daily.  Family history of gastric ulcer  Pain aggravated by eating or drinking -     H. pylori breath test -     COMPLETE METABOLIC PANEL WITH GFR -     CBC w/Diff/Platelet -     Lipase -     US Abdomen Complete; Future -      omeprazole (PRILOSEC) 40 MG capsule; Take 1 capsule (40 mg total) by mouth daily. -     sucralfate (CARAFATE) 1 g tablet; Take 1 tablet (1 g total) by mouth 4 (four) times daily.   Reassurance not cardiac with normal EKG Will get labs today to look for signs of pancreatitis, anemia, h. Pylori, cholecystitis.  Likely gastritis but no known etiology today.  GI cocktail given today Will get abdominal u/s Start omeprazole 40mg  daily and carafate for 7 days.  Follow up in 2 weeks with PCP or as needed if symptoms worsen or persist.    , PA-C

## 2022-08-16 NOTE — Patient Instructions (Signed)
Gastritis, Adult Gastritis is inflammation of the stomach. There are two kinds of gastritis: Acute gastritis. This kind develops suddenly. Chronic gastritis. This kind is much more common. It develops slowly and lasts for a long time. Gastritis happens when the lining of the stomach becomes weak or gets damaged. Without treatment, gastritis can lead to stomach bleeding and ulcers. What are the causes? This condition may be caused by: An infection. Drinking too much alcohol. Certain medicines. These include steroids, antibiotics, and some over-the-counter medicines, such as aspirin or ibuprofen. Having too much acid in the stomach. Having a disease of the stomach. Other causes may include: An allergic reaction. Some cancer treatments (radiation). Smoking cigarettes or the use of products that contain nicotine or tobacco. In some cases, the cause of this condition is not known. What increases the risk? Having a disease of the intestines. Having a disease in which the body's immune system attacks the body (autoimmune disease), such as Crohn's disease. Using aspirin or ibuprofen and other NSAIDs to treat other conditions, such as heart disease or chronic pain. Stress. What are the signs or symptoms? Symptoms of this condition include: Pain or a burning sensation in the upper abdomen. Nausea. Vomiting. An uncomfortable feeling of fullness after eating. Weight loss. Bad breath. Blood in your vomit or stool (feces). In some cases, there are no symptoms. How is this diagnosed? This condition may be diagnosed based on your medical history, a physical exam, and tests. Tests may include: Your medical history and a description of your symptoms. A physical exam. Tests. These can include: Blood tests. Stool tests. A test in which a thin, flexible instrument with a light and a camera is passed down the esophagus and into the stomach (upper endoscopy). A test in which a tissue sample is  removed to look at it under a microscope (biopsy). How is this treated? This condition may be treated with medicines. The medicines that are used vary depending on the cause of the gastritis. If the condition is caused by a bacterial infection, you may be given antibiotic medicines. If the condition is caused by too much acid in the stomach, you may be given medicines called H2 blockers, proton pump inhibitors, or antacids. Treatment may also involve stopping the use of certain medicines such as aspirin or ibuprofen and other NSAIDs. Follow these instructions at home: Medicines Take over-the-counter and prescription medicines only as told by your health care provider. If you were prescribed an antibiotic medicine, take it as told by your health care provider. Do not stop taking the antibiotic even if you start to feel better. Alcohol use Do not drink alcohol if: Your health care provider tells you not to drink. You are pregnant, may be pregnant, or are planning to become pregnant. If you drink alcohol: Limit your use to: 0-1 drink a day for women. 0-2 drinks a day for men. Know how much alcohol is in your drink. In the [place] one drink equals one 12 oz bottle of beer (355 mL), one 5 oz glass of wine (148 mL), or one 1 oz glass of hard liquor (44 mL). General instructions  Eat small, frequent meals instead of large meals. Avoid foods and drinks that make your symptoms worse. Talk with your health care provider about ways to manage stress, such as getting regular exercise or practicing deep breathing, meditation, or yoga. Do not use any products that contain nicotine or tobacco. These products include cigarettes, chewing tobacco, and vaping devices, such as e-cigarettes.   If you need help quitting, ask your health care provider. Drink enough fluid to keep your urine pale yellow. Keep all follow-up visits. This is important. Contact a health care provider if: Your symptoms get worse. Your  abdominal pain gets worse. Your symptoms return after treatment. You have a fever. Get help right away if: You vomit blood or a substance that looks like coffee grounds. You have black or dark red stools. You are unable to keep fluids down. These symptoms may represent a serious problem that is an emergency. Do not wait to see if the symptoms will go away. Get medical help right away. Call your local emergency services (911 in the [place] Do not drive yourself to the hospital. Summary Gastritis is inflammation of the lining of the stomach that can occur suddenly (acute) or develop slowly over time (chronic). This condition is diagnosed with a medical history, a physical exam, or tests. This condition may be treated with medicines to treat infection or medicines to reduce the amount of acid in your stomach. Follow your health care provider's instructions about taking medicines, making changes to your diet, and knowing when to call for help. This information is not intended to replace advice given to you by your health care provider. Make sure you discuss any questions you have with your health care provider. Document Revised: 03/26/2021 Document Reviewed: 03/26/2021 Elsevier Patient Education  2023 [employer]  

## 2022-08-17 ENCOUNTER — Ambulatory Visit (INDEPENDENT_AMBULATORY_CARE_PROVIDER_SITE_OTHER): Payer: No Typology Code available for payment source

## 2022-08-17 DIAGNOSIS — R52 Pain, unspecified: Secondary | ICD-10-CM

## 2022-08-17 DIAGNOSIS — R1013 Epigastric pain: Secondary | ICD-10-CM | POA: Diagnosis not present

## 2022-08-17 DIAGNOSIS — R110 Nausea: Secondary | ICD-10-CM | POA: Diagnosis not present

## 2022-08-17 MED ORDER — HYDROCHLOROTHIAZIDE 25 MG PO TABS: 25.0000 mg | ORAL_TABLET | Freq: Every day | ORAL | 3 refills | Status: DC

## 2022-08-17 MED ORDER — LOSARTAN POTASSIUM 25 MG PO TABS: 25.0000 mg | ORAL_TABLET | Freq: Every day | ORAL | 3 refills | Status: DC

## 2022-08-18 ENCOUNTER — Encounter: Payer: Self-pay | Admitting: Physician Assistant

## 2022-08-18 DIAGNOSIS — K8020 Calculus of gallbladder without cholecystitis without obstruction: Secondary | ICD-10-CM | POA: Insufficient documentation

## 2022-08-18 NOTE — Progress Notes (Signed)
Normal pancreatic enzymes.  Normal WBC and hemoglobin. No sign of acute blood loss.  Still awaiting h.pylori testing.  Normal kidney function Your liver enzymes are very elevated.  Wait for h.pylori to return if negative Will send to general surgery to consider gallbladder removal for gallstone.

## 2022-08-18 NOTE — Progress Notes (Signed)
No acute gallbladder inflammation or infection. You do have a stone in the gallbladder.you do have evidence of fatty liver.   Weight loss can help with fatty liver. Will correlate with liver enyzmes.

## 2022-08-18 NOTE — Addendum Note (Signed)
Addended by: Chalmers Cater on: 08/18/2022 11:29 AM   Modules accepted: Orders

## 2022-08-21 ENCOUNTER — Other Ambulatory Visit: Payer: Self-pay | Admitting: Physician Assistant

## 2022-08-21 DIAGNOSIS — R1013 Epigastric pain: Secondary | ICD-10-CM

## 2022-08-21 DIAGNOSIS — K8020 Calculus of gallbladder without cholecystitis without obstruction: Secondary | ICD-10-CM

## 2022-08-21 DIAGNOSIS — R748 Abnormal levels of other serum enzymes: Secondary | ICD-10-CM

## 2022-08-21 LAB — COMPLETE METABOLIC PANEL WITH GFR
AG Ratio: 1.6 (calc) (ref 1.0–2.5)
ALT: 202 U/L — ABNORMAL HIGH (ref 9–46)
AST: 170 U/L — ABNORMAL HIGH (ref 10–35)
Albumin: 4.7 g/dL (ref 3.6–5.1)
Alkaline phosphatase (APISO): 107 U/L (ref 35–144)
BUN: 13 mg/dL (ref 7–25)
CO2: 31 mmol/L (ref 20–32)
Calcium: 10.8 mg/dL — ABNORMAL HIGH (ref 8.6–10.3)
Chloride: 100 mmol/L (ref 98–110)
Creat: 1.05 mg/dL (ref 0.70–1.30)
Globulin: 3 g/dL (calc) (ref 1.9–3.7)
Glucose, Bld: 89 mg/dL (ref 65–99)
Potassium: 4 mmol/L (ref 3.5–5.3)
Sodium: 140 mmol/L (ref 135–146)
Total Bilirubin: 2.9 mg/dL — ABNORMAL HIGH (ref 0.2–1.2)
Total Protein: 7.7 g/dL (ref 6.1–8.1)
eGFR: 86 mL/min/{1.73_m2} (ref >=60)

## 2022-08-21 LAB — CBC WITH DIFFERENTIAL/PLATELET
Absolute Monocytes: 716 cells/uL (ref 200–950)
Basophils Absolute: 31 cells/uL (ref 0–200)
Basophils Relative: 0.4 %
Eosinophils Absolute: 108 cells/uL (ref 15–500)
Eosinophils Relative: 1.4 %
HCT: 44.3 % (ref 38.5–50.0)
Hemoglobin: 15.6 g/dL (ref 13.2–17.1)
Lymphs Abs: 2079 cells/uL (ref 850–3900)
MCH: 31.5 pg (ref 27.0–33.0)
MCHC: 35.2 g/dL (ref 32.0–36.0)
MCV: 89.3 fL (ref 80.0–100.0)
MPV: 12.3 fL (ref 7.5–12.5)
Monocytes Relative: 9.3 %
Neutro Abs: 4766 cells/uL (ref 1500–7800)
Neutrophils Relative %: 61.9 %
Platelets: 192 10*3/uL (ref 140–400)
RBC: 4.96 10*6/uL (ref 4.20–5.80)
RDW: 12.1 % (ref 11.0–15.0)
Total Lymphocyte: 27 %
WBC: 7.7 10*3/uL (ref 3.8–10.8)

## 2022-08-21 LAB — H. PYLORI BREATH TEST: H. pylori Breath Test: NOT DETECTED

## 2022-08-21 LAB — LIPASE: Lipase: 47 U/L (ref 7–60)

## 2022-08-21 NOTE — Progress Notes (Signed)
H.pylori negative. How are you feeling? Recheck liver enzymes this week!

## 2022-08-23 ENCOUNTER — Encounter: Payer: Self-pay | Admitting: Physician Assistant

## 2022-08-26 LAB — CBC WITH DIFFERENTIAL/PLATELET
Absolute Monocytes: 601 cells/uL (ref 200–950)
Basophils Absolute: 40 cells/uL (ref 0–200)
Basophils Relative: 0.6 %
Eosinophils Absolute: 79 cells/uL (ref 15–500)
Eosinophils Relative: 1.2 %
HCT: 44 % (ref 38.5–50.0)
Hemoglobin: 15.1 g/dL (ref 13.2–17.1)
Lymphs Abs: 2317 cells/uL (ref 850–3900)
MCH: 30.6 pg (ref 27.0–33.0)
MCHC: 34.3 g/dL (ref 32.0–36.0)
MCV: 89.1 fL (ref 80.0–100.0)
MPV: 13.2 fL — ABNORMAL HIGH (ref 7.5–12.5)
Monocytes Relative: 9.1 %
Neutro Abs: 3564 cells/uL (ref 1500–7800)
Neutrophils Relative %: 54 %
Platelets: 160 10*3/uL (ref 140–400)
RBC: 4.94 10*6/uL (ref 4.20–5.80)
RDW: 12 % (ref 11.0–15.0)
Total Lymphocyte: 35.1 %
WBC: 6.6 10*3/uL (ref 3.8–10.8)

## 2022-08-26 LAB — COMPLETE METABOLIC PANEL WITH GFR
AG Ratio: 1.7 (calc) (ref 1.0–2.5)
ALT: 53 U/L — ABNORMAL HIGH (ref 9–46)
AST: 36 U/L — ABNORMAL HIGH (ref 10–35)
Albumin: 4.5 g/dL (ref 3.6–5.1)
Alkaline phosphatase (APISO): 76 U/L (ref 35–144)
BUN: 16 mg/dL (ref 7–25)
CO2: 26 mmol/L (ref 20–32)
Calcium: 9.6 mg/dL (ref 8.6–10.3)
Chloride: 103 mmol/L (ref 98–110)
Creat: 0.96 mg/dL (ref 0.70–1.30)
Globulin: 2.6 g/dL (calc) (ref 1.9–3.7)
Glucose, Bld: 113 mg/dL — ABNORMAL HIGH (ref 65–99)
Potassium: 3.5 mmol/L (ref 3.5–5.3)
Sodium: 139 mmol/L (ref 135–146)
Total Bilirubin: 0.6 mg/dL (ref 0.2–1.2)
Total Protein: 7.1 g/dL (ref 6.1–8.1)
eGFR: 96 mL/min/{1.73_m2} (ref >=60)

## 2022-08-26 LAB — LIPASE: Lipase: 61 U/L — ABNORMAL HIGH (ref 7–60)

## 2022-08-28 ENCOUNTER — Other Ambulatory Visit: Payer: Self-pay

## 2022-08-28 DIAGNOSIS — R748 Abnormal levels of other serum enzymes: Secondary | ICD-10-CM

## 2022-08-28 NOTE — Progress Notes (Signed)
Calcium improved Liver enzymes improving a lot! Almost back down to normal Your lipase did go up just a little showing some pancreatic inflammation How is your pain and symptoms?

## 2022-08-28 NOTE — Progress Notes (Signed)
Ordered labs

## 2022-08-30 LAB — H. PYLORI BREATH TEST: H. pylori Breath Test: NOT DETECTED

## 2022-09-01 ENCOUNTER — Ambulatory Visit: Payer: No Typology Code available for payment source | Admitting: Physician Assistant

## 2022-09-01 ENCOUNTER — Encounter: Payer: Self-pay | Admitting: Physician Assistant

## 2022-09-01 VITALS — BP 124/76 | HR 66 | Ht 67.0 in | Wt 274.0 lb

## 2022-09-01 DIAGNOSIS — R748 Abnormal levels of other serum enzymes: Secondary | ICD-10-CM | POA: Diagnosis not present

## 2022-09-01 DIAGNOSIS — K8020 Calculus of gallbladder without cholecystitis without obstruction: Secondary | ICD-10-CM | POA: Diagnosis not present

## 2022-09-01 DIAGNOSIS — Z6841 Body Mass Index (BMI) 40.0 and over, adult: Secondary | ICD-10-CM | POA: Diagnosis not present

## 2022-09-01 NOTE — Progress Notes (Signed)
Established Patient Office Visit  Subjective   Patient ID: Drew Kim, male    DOB: 03-Jul-1971  Age: 51 y.o. MRN: [id number]  Chief Complaint  Patient presents with   Follow-up    HPI  Pt is a 51 yo M presenting for a f/u on epigastric pain 2 weeks ago that has since improved significantly on omeprazole daily and carafate occasionally. He has had 1 episode of milder epigastric pain following a meal that resolved, but otherwise has had no other pain or nausea. He wonders if he should continue the omeprazole and when to recheck liver enzymes. He has been contacted by general surgery for gallstone. Denies CP, SOB, nausea, vomiting, abdominal pain, bowel changes, constipation, diarrhea. Denies alcohol use and has been limiting fried foods. He does drink a lot of diet soda.  ROS See HPI.    Objective:     BP 124/76   Pulse 66   Ht $R'5\' 7"'Yb$  (1.702 m)   Wt 274 lb (124.3 kg)   SpO2 100%   BMI 42.91 kg/m  BP Readings from Last 3 Encounters:  09/01/22 124/76  08/16/22 (!) 140/79  04/13/22 129/79   Wt Readings from Last 3 Encounters:  09/01/22 274 lb (124.3 kg)  08/16/22 274 lb 1.3 oz (124.3 kg)  04/13/22 274 lb (124.3 kg)      Physical Exam Constitutional:      Appearance: He is obese.  HENT:     Head: Normocephalic.  Cardiovascular:     Rate and Rhythm: Normal rate and regular rhythm.     Pulses: Normal pulses.  Pulmonary:     Effort: Pulmonary effort is normal.     Breath sounds: Normal breath sounds.  Abdominal:     General: Bowel sounds are normal.     Palpations: Abdomen is soft. There is no mass.     Tenderness: There is no abdominal tenderness. There is no guarding or rebound.     Comments: Ventral hernia present, nontender to palpation Negative Murphy sign  Skin:    General: Skin is warm and dry.  Neurological:     Mental Status: He is alert and oriented to person, place, and time.  Psychiatric:        Mood and Affect: Mood normal.        Behavior:  Behavior normal.        Last CBC Lab Results  Component Value Date   WBC 6.6 08/25/2022   HGB 15.1 08/25/2022   HCT 44.0 08/25/2022   MCV 89.1 08/25/2022   MCH 30.6 08/25/2022   RDW 12.0 08/25/2022   PLT 160 18/56/3149   Last metabolic panel Lab Results  Component Value Date   GLUCOSE 113 (H) 08/25/2022   NA 139 08/25/2022   K 3.5 08/25/2022   CL 103 08/25/2022   CO2 26 08/25/2022   BUN 16 08/25/2022   CREATININE 0.96 08/25/2022   EGFR 96 08/25/2022   CALCIUM 9.6 08/25/2022   PROT 7.1 08/25/2022   BILITOT 0.6 08/25/2022   AST 36 (H) 08/25/2022   ALT 53 (H) 08/25/2022   Last lipids Lab Results  Component Value Date   CHOL 240 (H) 07/26/2018   HDL 47 07/26/2018   LDLCALC 158 (H) 07/26/2018   TRIG 195 (H) 07/26/2018   CHOLHDL 5.1 (H) 07/26/2018   Last hemoglobin A1c No results found for: "HGBA1C" Last thyroid functions Lab Results  Component Value Date   TSH 0.81 07/26/2018       Assessment &  Plan:   ..Ezell was seen today for follow-up.  Diagnoses and all orders for this visit:  Elevated liver enzymes -     Hepatic function panel -     Lipase  Calculus of gallbladder without cholecystitis without obstruction -     Hepatic function panel -     Lipase  Elevated lipase -     Hepatic function panel -     Lipase  Class 3 severe obesity due to excess calories without serious comorbidity with body mass index (BMI) of 40.0 to 44.9 in adult [facility])  Will recheck lipase, liver function a week from Monday  Continue omeprazole daily  Encouraged getting a consult from general surgery on gallstone  Discussed risk vs benefit of medication for gallstone in future if abdominal pain comes back Educated on low fat diet, decreasing diet soda consumption, and avoiding alcohol and NSAIDs Encouraged weight loss with regular exercise as well Follow up as needed for worsening symptoms     Iran Planas, PA-C

## 2022-09-01 NOTE — Progress Notes (Signed)
Negative for h pylori

## 2022-09-01 NOTE — Patient Instructions (Signed)
Cholelithiasis  Cholelithiasis is a disease in which gallstones form in the gallbladder. The gallbladder is an organ that stores bile. Bile is a fluid that helps to digest fats. Gallstones begin as small crystals and can slowly grow into stones. They may cause no symptoms until they block the gallbladder duct, or cystic duct, when the gallbladder tightens (contracts) after food is eaten. This can cause pain and is known as a gallbladder attack, or biliary colic. There are two main types of gallstones: Cholesterol stones. These are the most common type of gallstone. These stones are made of hardened cholesterol and are usually yellow-green in color. Cholesterol is a fat-like substance that is made in the liver. Pigment stones. These are dark in color and are made of a red-yellow substance, called bilirubin,that forms when hemoglobin from red blood cells breaks down. What are the causes? This condition may be caused by an imbalance in the different parts that make bile. This can happen if the bile: Has too much bilirubin. This can happen in certain blood diseases, such as sickle cell anemia. Has too much cholesterol. Does not have enough bile salts. These salts help the body absorb and digest fats. In some cases, this condition can also be caused by the gallbladder not emptying completely or often enough. This is common during pregnancy. What increases the risk? The following factors may make you more likely to develop this condition: Being male. Having multiple pregnancies. Health care providers sometimes advise removing diseased gallbladders before future pregnancies. Eating a diet that is heavy in fried foods, fat, and refined carbohydrates, such as white bread and white rice. Being obese. Being older than age 40. Using medicines that contain male hormones (estrogen) for a long time. Losing weight quickly. Having a family history of gallstones. Having certain medical problems, such  as: Diabetes mellitus. Cystic fibrosis. Crohn's disease. Cirrhosis or other long-term (chronic) liver disease. Certain blood diseases, such as sickle cell anemia or leukemia. What are the signs or symptoms? In many cases, having gallstones causes no symptoms. When you have gallstones but do not have symptoms, you have silent gallstones. If a gallstone blocks your bile duct, it can cause a gallbladder attack. The main symptom of a gallbladder attack is sudden pain in the upper right part of the abdomen. The pain: Usually comes at night or after eating. Can last for one hour or more. Can spread to your right shoulder, back, or chest. Can feel like indigestion. This is discomfort, burning, or fullness in your upper abdomen. If the bile duct is blocked for more than a few hours, it can cause an infection or inflammation of your gallbladder (cholecystitis), liver, or pancreas. This can cause: Nausea or vomiting. Bloating. Pain in your abdomen that lasts for 5 hours or longer. Tenderness in your upper abdomen, often in the upper right section and under your rib cage. Fever or chills. Skin or the white parts of your eyes turning yellow (jaundice). This usually happens when a stone has blocked bile from passing through the common bile duct. Dark urine or light-colored stools. How is this diagnosed? This condition may be diagnosed based on: A physical exam. Your medical history. Ultrasound. CT scan. MRI. You may also have other tests, including: Blood tests to check for signs of an infection or inflammation. Cholescintigraphy, or HIDA scan. This is a scan of your gallbladder and bile ducts (biliary system) using non-harmful radioactive material and special cameras that can see the radioactive material. Endoscopic retrograde cholangiopancreatogram. This   involves inserting a small tube with a camera on the end (endoscope) through your mouth to look at bile ducts and check for blockages. How is  this treated? Treatment for this condition depends on the severity of the condition. Silent gallstones do not need treatment. Treatment may be needed if a blockage causes a gallbladder attack or other symptoms. Treatment may include: Home care, if symptoms are not severe. During a simple gallbladder attack, stop eating and drinking for 12-24 hours (except for water and clear liquids). This helps to "cool down" your gallbladder. After 1 or 2 days, you can start to eat a diet of simple or clear foods, such as broths and crackers. You may also need medicines for pain or nausea or both. If you have cholecystitis and an infection, you will need antibiotics. A hospital stay, if needed for pain control or for cholecystitis with severe infection. Cholecystectomy, or surgery to remove your gallbladder. This is the most common treatment if all other treatments have not worked. Medicines to break up gallstones. These are most effective at treating small gallstones. Medicines may be used for up to 6-12 months. Endoscopic retrograde cholangiopancreatogram. A small basket can be attached to the endoscope and used to capture and remove gallstones, mainly those that are in the common bile duct. Follow these instructions at home: Medicines Take over-the-counter and prescription medicines only as told by your health care provider. If you were prescribed an antibiotic medicine, take it as told by your health care provider. Do not stop taking the antibiotic even if you start to feel better. Ask your health care provider if the medicine prescribed to you requires you to avoid driving or using machinery. Eating and drinking Drink enough fluid to keep your urine pale yellow. This is important during a gallbladder attack. Water and clear liquids are preferred. Follow a healthy diet. This includes: Reducing fatty foods, such as fried food and foods high in cholesterol. Reducing refined carbohydrates, such as white bread  and white rice. Eating more fiber. Aim for foods such as almonds, fruit, and beans. Alcohol use If you drink alcohol: Limit how much you use to: 0-1 drink a day for nonpregnant women. 0-2 drinks a day for men. Be aware of how much alcohol is in your drink. In the [place] one drink equals one 12 oz bottle of beer (355 mL), one 5 oz glass of wine (148 mL), or one 1 oz glass of hard liquor (44 mL). General instructions Do not use any products that contain nicotine or tobacco, such as cigarettes, e-cigarettes, and chewing tobacco. If you need help quitting, ask your health care provider. Maintain a healthy weight. Keep all follow-up visits as told by your health care provider. These may include consultations with a surgeon or specialist. This is important. Where to find more information National Institute of Diabetes and Digestive and Kidney Diseases: www.niddk.nih.gov Contact a health care provider if: You think you have had a gallbladder attack. You have been diagnosed with silent gallstones and you develop pain in your abdomen or indigestion. You begin to have attacks more often. You have dark urine or light-colored stools. Get help right away if: You have pain from a gallbladder attack that lasts for more than 2 hours. You have pain in your abdomen that lasts for more than 5 hours or is getting worse. You have a fever or chills. You have nausea and vomiting that do not go away. You develop jaundice. Summary Cholelithiasis is a disease in which   gallstones form in the gallbladder. This condition may be caused by an imbalance in the different parts that make bile. This can happen if your bile has too much bilirubin or cholesterol, or does not have enough bile salts. Treatment for gallstones depends on the severity of the condition. Silent gallstones do not need treatment. If gallstones cause a gallbladder attack or other symptoms, treatment usually involves not eating or drinking anything.  Treatment may also include pain medicines and antibiotics, and it sometimes includes a hospital stay. Surgery to remove the gallbladder is common if all other treatments have not worked. This information is not intended to replace advice given to you by your health care provider. Make sure you discuss any questions you have with your health care provider. Document Revised: 10/13/2019 Document Reviewed: 10/13/2019 Elsevier Patient Education  2023 [employer]  

## 2022-10-06 ENCOUNTER — Encounter: Payer: Self-pay | Admitting: Family Medicine

## 2022-10-06 ENCOUNTER — Encounter: Payer: Self-pay | Admitting: Physician Assistant

## 2022-10-16 ENCOUNTER — Ambulatory Visit: Payer: PRIVATE HEALTH INSURANCE | Admitting: Physician Assistant

## 2022-10-23 ENCOUNTER — Ambulatory Visit (INDEPENDENT_AMBULATORY_CARE_PROVIDER_SITE_OTHER): Payer: 59 | Admitting: Physician Assistant

## 2022-10-23 VITALS — BP 133/76 | HR 70 | Ht 67.0 in | Wt 282.0 lb

## 2022-10-23 DIAGNOSIS — I10 Essential (primary) hypertension: Secondary | ICD-10-CM

## 2022-10-23 DIAGNOSIS — K8020 Calculus of gallbladder without cholecystitis without obstruction: Secondary | ICD-10-CM

## 2022-10-23 DIAGNOSIS — R52 Pain, unspecified: Secondary | ICD-10-CM

## 2022-10-23 DIAGNOSIS — R1013 Epigastric pain: Secondary | ICD-10-CM

## 2022-10-23 DIAGNOSIS — R110 Nausea: Secondary | ICD-10-CM

## 2022-10-23 DIAGNOSIS — Z13220 Encounter for screening for lipoid disorders: Secondary | ICD-10-CM

## 2022-10-23 DIAGNOSIS — R5383 Other fatigue: Secondary | ICD-10-CM

## 2022-10-23 MED ORDER — OMEPRAZOLE 40 MG PO CPDR: 40.0000 mg | DELAYED_RELEASE_CAPSULE | Freq: Every day | ORAL | 1 refills | Status: AC

## 2022-10-23 NOTE — Progress Notes (Unsigned)
Established Patient Office Visit  Subjective   Patient ID: Drew Kim, male    DOB: May 23, 1971  Age: 51 y.o. MRN: [id number]  Chief Complaint  Patient presents with   Follow-up    HPI Patient is a 51 year old obese male with hypertension, gallstones and history of epigastric abdominal pain, GERD who presents to the clinic for follow-up and medication refills.  Patient has not gotten his labs but he states that he will after Thanksgiving.  Patient is compliant with losartan and hydrochlorothiazide.  He denies any chest pain, palpitations, headaches or vision changes.  He has not had any more problems with his epigastric pain, nausea.  He does have known gallstones and has seen general surgery.  They did suggest cholecystectomy but he has declined at this time.  He is not currently taking omeprazole but would like a refill.  He does notice days where his reflux is worse.  He denies any melena or hematochezia.   He has been more tired for the last few weeks. No fever, chills, body aches. He did have URI with cough for a few weeks.    .. Active Ambulatory Problems    Diagnosis Date Noted   Obesity, Class II, BMI 35-39.9, no comorbidity 03/01/2016   Essential hypertension, benign 09/05/2021   Left shoulder pain 04/13/2022   Family history of gastric ulcer 08/16/2022   Nausea 08/16/2022   Epigastric abdominal pain 08/16/2022   Pain aggravated by eating or drinking 08/16/2022   Gallstone 08/18/2022   Elevated liver enzymes 09/01/2022   Elevated lipase 09/01/2022   Class 3 severe obesity due to excess calories without serious comorbidity with body mass index (BMI) of 40.0 to 44.9 in adult [facility]) 09/01/2022   Resolved Ambulatory Problems    Diagnosis Date Noted   Otitis, externa, infective 02/29/2016   Acute nonsuppurative otitis media of left ear 02/29/2016   Elevated BP 02/29/2016   Past Medical History:  Diagnosis Date   Bronchitis     ROS   See HPI.  Objective:      There were no vitals taken for this visit. BP Readings from Last 3 Encounters:  10/23/22 (!) 146/74  09/01/22 124/76  08/16/22 (!) 140/79   Wt Readings from Last 3 Encounters:  10/23/22 282 lb (127.9 kg)  09/01/22 274 lb (124.3 kg)  08/16/22 274 lb 1.3 oz (124.3 kg)    ..    10/23/2022    4:20 PM 04/13/2022    3:51 PM 07/26/2018    9:07 AM 06/21/2017    3:45 PM  Depression screen PHQ 2/9  Decreased Interest 0 0 0 0  Down, Depressed, Hopeless 0 0 0 0  PHQ - 2 Score 0 0 0 0  Altered sleeping 0  0   Tired, decreased energy 1  1   Change in appetite 0  0   Feeling bad or failure about yourself  0  0   Trouble concentrating 0  0   Moving slowly or fidgety/restless 0  0   Suicidal thoughts 0  0   PHQ-9 Score 1  1   Difficult doing work/chores Somewhat difficult  Not difficult at all    ..    10/23/2022    4:21 PM 07/26/2018    9:07 AM  GAD 7 : Generalized Anxiety Score  Nervous, Anxious, on Edge 0 0  Control/stop worrying 0 0  Worry too much - different things 0 0  Trouble relaxing 0 0  Restless 0 0  Easily  annoyed or irritable 0 0  Afraid - awful might happen 0 0  Total GAD 7 Score 0 0  Anxiety Difficulty Not difficult at all Not difficult at all      Physical Exam Constitutional:      Appearance: Normal appearance. He is obese.  HENT:     Head: Normocephalic.  Cardiovascular:     Rate and Rhythm: Normal rate and regular rhythm.  Pulmonary:     Effort: Pulmonary effort is normal.     Breath sounds: Normal breath sounds.  Abdominal:     General: Bowel sounds are normal. There is no distension.     Palpations: Abdomen is soft. There is no mass.     Tenderness: There is no abdominal tenderness. There is no right CVA tenderness, left CVA tenderness, guarding or rebound.     Hernia: No hernia is present.  Musculoskeletal:     Right lower leg: No edema.     Left lower leg: No edema.  Neurological:     General: No focal deficit present.     Mental Status: He  is alert and oriented to person, place, and time.  Psychiatric:        Mood and Affect: Mood normal.        Assessment & Plan:  Marland KitchenMarland KitchenJordynn was seen today for follow-up.  Diagnoses and all orders for this visit:  Essential hypertension, benign -     COMPLETE METABOLIC PANEL WITH GFR  Nausea -     omeprazole (PRILOSEC) 40 MG capsule; Take 1 capsule (40 mg total) by mouth daily. -     Hepatic function panel  Epigastric abdominal pain -     omeprazole (PRILOSEC) 40 MG capsule; Take 1 capsule (40 mg total) by mouth daily. -     COMPLETE METABOLIC PANEL WITH GFR -     Testosterone Total,Free,Bio, Males -     Lipid Panel w/reflex Direct LDL -     TSH -     CBC w/Diff/Platelet -     B12 and Folate Panel -     VITAMIN D 25 Hydroxy (Vit-D Deficiency, Fractures) -     PSA -     Lipase -     Hepatic function panel  Pain aggravated by eating or drinking -     omeprazole (PRILOSEC) 40 MG capsule; Take 1 capsule (40 mg total) by mouth daily. -     Lipase -     Hepatic function panel  Calculus of gallbladder without cholecystitis without obstruction -     COMPLETE METABOLIC PANEL WITH GFR -     Testosterone Total,Free,Bio, Males -     Lipid Panel w/reflex Direct LDL -     TSH -     CBC w/Diff/Platelet -     B12 and Folate Panel -     VITAMIN D 25 Hydroxy (Vit-D Deficiency, Fractures) -     PSA -     Lipase -     Hepatic function panel  No energy -     COMPLETE METABOLIC PANEL WITH GFR -     Testosterone Total,Free,Bio, Males -     TSH -     CBC w/Diff/Platelet -     B12 and Folate Panel -     VITAMIN D 25 Hydroxy (Vit-D Deficiency, Fractures)  Screening for lipid disorders -     Lipid Panel w/reflex Direct LDL    Needs labs and follow up labs on lipase and liver enzyme elevation  Pt states will get after Thanksgiving holiday and will be fasting Labs ordered to evaluate low energy ? Sleep apena but patient reports waking up feeling rested Refilled cozaar and HCTZ, BP  did go down on 2nd recheck Follow up in 6 months or as needed   Tandy Gaw, PA-C

## 2022-10-24 ENCOUNTER — Encounter: Payer: Self-pay | Admitting: Physician Assistant

## 2022-10-24 DIAGNOSIS — R5383 Other fatigue: Secondary | ICD-10-CM | POA: Insufficient documentation

## 2022-10-24 MED ORDER — LOSARTAN POTASSIUM 25 MG PO TABS: 25.0000 mg | ORAL_TABLET | Freq: Every day | ORAL | 3 refills | Status: DC

## 2022-10-24 MED ORDER — HYDROCHLOROTHIAZIDE 25 MG PO TABS: 25.0000 mg | ORAL_TABLET | Freq: Every day | ORAL | 3 refills | Status: DC

## 2023-04-23 ENCOUNTER — Ambulatory Visit: Payer: No Typology Code available for payment source | Admitting: Physician Assistant

## 2023-05-08 ENCOUNTER — Other Ambulatory Visit: Payer: Self-pay | Admitting: Family Medicine

## 2023-05-08 ENCOUNTER — Ambulatory Visit: Payer: No Typology Code available for payment source | Admitting: Physician Assistant

## 2023-05-08 DIAGNOSIS — I10 Essential (primary) hypertension: Secondary | ICD-10-CM

## 2023-05-09 NOTE — Telephone Encounter (Signed)
Pls contact the pt to schedule past due 6-mth followup with [facility]. Thanks

## 2023-05-28 ENCOUNTER — Ambulatory Visit: Payer: No Typology Code available for payment source | Admitting: Physician Assistant

## 2023-06-13 ENCOUNTER — Other Ambulatory Visit: Payer: Self-pay | Admitting: Physician Assistant

## 2023-06-13 DIAGNOSIS — I10 Essential (primary) hypertension: Secondary | ICD-10-CM

## 2023-08-24 ENCOUNTER — Other Ambulatory Visit: Payer: Self-pay | Admitting: Physician Assistant

## 2023-08-24 DIAGNOSIS — I10 Essential (primary) hypertension: Secondary | ICD-10-CM

## 2024-01-12 ENCOUNTER — Other Ambulatory Visit: Payer: Self-pay

## 2024-01-12 ENCOUNTER — Emergency Department (HOSPITAL_BASED_OUTPATIENT_CLINIC_OR_DEPARTMENT_OTHER): Payer: No Typology Code available for payment source

## 2024-01-12 ENCOUNTER — Encounter (HOSPITAL_BASED_OUTPATIENT_CLINIC_OR_DEPARTMENT_OTHER): Payer: Self-pay | Admitting: *Deleted

## 2024-01-12 ENCOUNTER — Emergency Department (HOSPITAL_BASED_OUTPATIENT_CLINIC_OR_DEPARTMENT_OTHER)
Admission: EM | Admit: 2024-01-12 | Discharge: 2024-01-12 | Disposition: A | Discharge status: Home / Self Care | Payer: No Typology Code available for payment source | Attending: Emergency Medicine | Admitting: Emergency Medicine

## 2024-01-12 DIAGNOSIS — S0003XA Contusion of scalp, initial encounter: Secondary | ICD-10-CM | POA: Diagnosis not present

## 2024-01-12 DIAGNOSIS — W240XXA Contact with lifting devices, not elsewhere classified, initial encounter: Secondary | ICD-10-CM | POA: Diagnosis not present

## 2024-01-12 DIAGNOSIS — S0990XA Unspecified injury of head, initial encounter: Secondary | ICD-10-CM | POA: Diagnosis present

## 2024-01-12 NOTE — ED Triage Notes (Signed)
 Pt was stuck in the head with the fork from a forklift around 2:30 today.  No LOC.  Pt initially felt nauseated but this has resolved.  Pt is not on any blood thinners.  Alert and oriented

## 2024-01-12 NOTE — ED Provider Notes (Signed)
 [facility] Provider Note   CSN: [id number] Arrival date & time: 01/12/24  1634     History Chief Complaint  Patient presents with   Head Injury    Drew Kim is a 53 y.o. male.  Patient without significant medical history presents to the emergency department concerns of a head injury.  He reports that he was at work when he was struck in the head by a forklift.  States that the impact was to the left side of his head.  He denies any loss of consciousness and felt some nausea briefly but this has resolved.  He is not currently on blood thinners.   Head Injury Associated symptoms: nausea        Home Medications Prior to Admission medications   Medication Sig Start Date End Date Taking? Authorizing Provider  hydrochlorothiazide  (HYDRODIURIL ) 25 MG tablet TAKE 1 TABLET BY MOUTH DAILY 08/29/23   Breeback, Jade L, PA-C  losartan  (COZAAR ) 25 MG tablet TAKE 1 TABLET BY MOUTH DAILY 08/29/23   Breeback, Jade L, PA-C  omeprazole  (PRILOSEC) 40 MG capsule Take 1 capsule (40 mg total) by mouth daily. 10/23/22   Breeback, Jade L, PA-C      Allergies    Patient has no known allergies.    Review of Systems   Review of Systems  Gastrointestinal:  Positive for nausea.  All other systems reviewed and are negative.   Physical Exam Updated Vital Signs BP (!) 143/110 (BP Location: Left Arm)   Pulse 85   Temp 98 F (36.7 C) (Oral)   Resp 18   SpO2 98%  Physical Exam Vitals and nursing note reviewed.  Constitutional:      General: He is not in acute distress.    Appearance: He is well-developed.  HENT:     Head: Normocephalic and atraumatic.  Eyes:     Conjunctiva/sclera: Conjunctivae normal.  Cardiovascular:     Rate and Rhythm: Normal rate and regular rhythm.     Heart sounds: No murmur heard. Pulmonary:     Effort: Pulmonary effort is normal. No respiratory distress.     Breath sounds: Normal breath sounds.  Abdominal:      Palpations: Abdomen is soft.     Tenderness: There is no abdominal tenderness.  Musculoskeletal:        General: No swelling. Normal range of motion.     Cervical back: Neck supple.  Skin:    General: Skin is warm and dry.     Capillary Refill: Capillary refill takes less than 2 seconds.     Findings: No bruising or lesion.     Comments: Small hematoma noted to the central portion of patient's scalp.  No evident laceration or bruising.  Neurological:     Mental Status: He is alert.  Psychiatric:        Mood and Affect: Mood normal.     ED Results / Procedures / Treatments   Labs (all labs ordered are listed, but only abnormal results are displayed) Labs Reviewed - No data to display  EKG None  Radiology CT Head Wo Contrast Result Date: 01/12/2024 CLINICAL DATA:  Status post trauma. EXAM: CT HEAD WITHOUT CONTRAST TECHNIQUE: Contiguous axial images were obtained from the base of the skull through the vertex without intravenous contrast. RADIATION DOSE REDUCTION: This exam was performed according to the departmental dose-optimization program which includes automated exposure control, adjustment of the mA and/or kV according to patient size and/or use  of iterative reconstruction technique. COMPARISON:  None Available. FINDINGS: Brain: No evidence of acute infarction, hemorrhage, hydrocephalus, extra-axial collection or mass lesion/mass effect. Vascular: No hyperdense vessel or unexpected calcification. Skull: Normal. Negative for fracture or focal lesion. Sinuses/Orbits: A 6 mm right maxillary sinus polyp versus mucous retention cyst is seen. Other: None. IMPRESSION: No acute intracranial abnormality. Electronically Signed   By: Suzen Dials M.D.   On: 01/12/2024 21:19    Procedures Procedures   Medications Ordered in ED Medications - No data to display  ED Course/ Medical Decision Making/ A&P                                 Medical Decision Making Amount and/or Complexity of  Data Reviewed Radiology: ordered.   This patient presents to the ED for concern of head injury.  Differential diagnosis includes concussion, SAH, subdural hematoma, scalp laceration    Imaging Studies ordered:  I ordered imaging studies including CT head I independently visualized and interpreted imaging which showed no evidence of any acute intracranial abnormality I agree with the radiologist interpretation   Problem List / ED Course:  Patient presents emergency department concerns of a head injury.  He reports that he was struck by a forklift at about 2:30 PM this afternoon.  Denies loss of consciousness.  Not currently feeling nauseous but states he had some brief nausea.  No significant headache seen at any point.  States that he has never had prior head injury or head trauma.  Concerned about some left-sided altered sensation although no significant motor or neurological deficits. Physical exam is unremarkable.  No acute neurological deficits.  Pupils are PERRL.  Given abnormal sensation to left side, obtain CT imaging but doubt significant findings.  This may be a byproduct of possible minor head injury/concussion. CT head imaging is negative. Given reassuring findings, advised patient that he should have repeat evaluation with PCP to ensure symptoms are improving.  If symptoms are acutely changing or worsening, advise return to the emergency department.  In the setting of known loss of consciousness the patient not currently on any blood thinners, doubt a slow bleed.  However, did advise that he may require repeat imaging if symptoms are worsening.  Encourage patient to take Tylenol ibuprofen as needed for headaches if they were to arise.  Given no other acute focal concerns, patient discharged home in stable condition with plans for outpatient follow-up.  Final Clinical Impression(s) / ED Diagnoses Final diagnoses:  Minor head injury, initial encounter    Rx / DC Orders ED  Discharge Orders     None         Cecily Legrand LABOR, PA-C 01/12/24 2257    Nicholaus Cassondra DEL, MD 01/13/24 1459

## 2024-01-12 NOTE — Discharge Instructions (Signed)
 You are seen in the emergency department today for concerns of a head injury.  Your CT scan of your head was thankfully negative.  I would recommend taking Tylenol or Motrin as needed for headaches.  You should follow-up with your primary care provider for further evaluation.  If you have any new or worsening symptoms, return to the emergency department.

## 2024-01-13 NOTE — ED Notes (Signed)
 This RN didn't discharge this patient but is tasked with taking him out of the computer.

## 2024-08-03 ENCOUNTER — Other Ambulatory Visit: Payer: Self-pay | Admitting: Physician Assistant

## 2024-08-03 DIAGNOSIS — I10 Essential (primary) hypertension: Secondary | ICD-10-CM
